# Patient Record
Sex: Male | Born: 1950 | Race: Black or African American | Hispanic: No | Marital: Married | Smoking: Former smoker
Health system: Southern US, Community
[De-identification: ages and names within clinical notes are randomized; demographics above are authoritative.]

## PROBLEM LIST (undated history)

## (undated) DIAGNOSIS — R972 Elevated prostate specific antigen [PSA]: Secondary | ICD-10-CM

## (undated) DIAGNOSIS — E785 Hyperlipidemia, unspecified: Secondary | ICD-10-CM

## (undated) DIAGNOSIS — Z8546 Personal history of malignant neoplasm of prostate: Secondary | ICD-10-CM

## (undated) DIAGNOSIS — I10 Essential (primary) hypertension: Secondary | ICD-10-CM

## (undated) DIAGNOSIS — M109 Gout, unspecified: Secondary | ICD-10-CM

## (undated) DIAGNOSIS — C801 Malignant (primary) neoplasm, unspecified: Secondary | ICD-10-CM

## (undated) DIAGNOSIS — N189 Chronic kidney disease, unspecified: Secondary | ICD-10-CM

## (undated) DIAGNOSIS — E739 Lactose intolerance, unspecified: Secondary | ICD-10-CM

## (undated) DIAGNOSIS — E119 Type 2 diabetes mellitus without complications: Secondary | ICD-10-CM

## (undated) DIAGNOSIS — C61 Malignant neoplasm of prostate: Secondary | ICD-10-CM

## (undated) DIAGNOSIS — D649 Anemia, unspecified: Secondary | ICD-10-CM

## (undated) HISTORY — DX: Malignant (primary) neoplasm, unspecified: C80.1

## (undated) HISTORY — PX: ESOPHAGOGASTRODUODENOSCOPY: SHX1529

## (undated) HISTORY — DX: Type 2 diabetes mellitus without complications: E11.9

## (undated) HISTORY — DX: Personal history of malignant neoplasm of prostate: Z85.46

## (undated) HISTORY — DX: Gout, unspecified: M10.9

## (undated) HISTORY — PX: COLONOSCOPY: SHX174

## (undated) HISTORY — DX: Essential (primary) hypertension: I10

## (undated) HISTORY — DX: Lactose intolerance, unspecified: E73.9

## (undated) HISTORY — DX: Anemia, unspecified: D64.9

## (undated) HISTORY — DX: Elevated prostate specific antigen (PSA): R97.20

## (undated) HISTORY — DX: Chronic kidney disease, unspecified: N18.9

## (undated) HISTORY — DX: Hyperlipidemia, unspecified: E78.5

---

## 2000-06-14 ENCOUNTER — Emergency Department (HOSPITAL_COMMUNITY): Admission: EM | Admit: 2000-06-14 | Discharge: 2000-06-14 | Payer: Self-pay | Admitting: Emergency Medicine

## 2002-08-11 ENCOUNTER — Encounter: Payer: Self-pay | Admitting: Emergency Medicine

## 2002-08-11 ENCOUNTER — Emergency Department (HOSPITAL_COMMUNITY): Admission: EM | Admit: 2002-08-11 | Discharge: 2002-08-11 | Payer: Self-pay | Admitting: Emergency Medicine

## 2007-12-11 HISTORY — PX: PROSTATE SURGERY: SHX751

## 2008-03-12 ENCOUNTER — Ambulatory Visit: Payer: Self-pay | Admitting: Internal Medicine

## 2008-03-12 DIAGNOSIS — E739 Lactose intolerance, unspecified: Secondary | ICD-10-CM

## 2008-03-12 DIAGNOSIS — I10 Essential (primary) hypertension: Secondary | ICD-10-CM | POA: Insufficient documentation

## 2008-03-12 HISTORY — DX: Lactose intolerance, unspecified: E73.9

## 2008-03-12 HISTORY — DX: Essential (primary) hypertension: I10

## 2008-03-18 ENCOUNTER — Encounter: Payer: Self-pay | Admitting: Internal Medicine

## 2008-03-18 DIAGNOSIS — R972 Elevated prostate specific antigen [PSA]: Secondary | ICD-10-CM | POA: Insufficient documentation

## 2008-03-18 HISTORY — DX: Elevated prostate specific antigen (PSA): R97.20

## 2008-03-22 ENCOUNTER — Encounter (INDEPENDENT_AMBULATORY_CARE_PROVIDER_SITE_OTHER): Payer: Self-pay | Admitting: *Deleted

## 2008-04-12 ENCOUNTER — Ambulatory Visit: Payer: Self-pay | Admitting: Internal Medicine

## 2008-04-12 DIAGNOSIS — E785 Hyperlipidemia, unspecified: Secondary | ICD-10-CM

## 2008-04-12 DIAGNOSIS — E119 Type 2 diabetes mellitus without complications: Secondary | ICD-10-CM | POA: Insufficient documentation

## 2008-04-12 DIAGNOSIS — E1165 Type 2 diabetes mellitus with hyperglycemia: Secondary | ICD-10-CM | POA: Insufficient documentation

## 2008-04-12 HISTORY — DX: Type 2 diabetes mellitus without complications: E11.9

## 2008-04-12 HISTORY — DX: Hyperlipidemia, unspecified: E78.5

## 2008-04-23 ENCOUNTER — Encounter: Payer: Self-pay | Admitting: Internal Medicine

## 2008-04-30 ENCOUNTER — Encounter (HOSPITAL_COMMUNITY): Admission: RE | Admit: 2008-04-30 | Discharge: 2008-07-23 | Payer: Self-pay | Admitting: Urology

## 2008-05-11 ENCOUNTER — Encounter: Payer: Self-pay | Admitting: Internal Medicine

## 2008-05-18 ENCOUNTER — Ambulatory Visit: Admission: RE | Admit: 2008-05-18 | Discharge: 2008-06-24 | Payer: Self-pay | Admitting: Radiation Oncology

## 2008-05-26 ENCOUNTER — Encounter: Payer: Self-pay | Admitting: Internal Medicine

## 2008-08-09 ENCOUNTER — Inpatient Hospital Stay (HOSPITAL_COMMUNITY): Admission: RE | Admit: 2008-08-09 | Discharge: 2008-08-10 | Payer: Self-pay | Admitting: Urology

## 2008-08-09 ENCOUNTER — Encounter (INDEPENDENT_AMBULATORY_CARE_PROVIDER_SITE_OTHER): Payer: Self-pay | Admitting: Urology

## 2008-08-19 ENCOUNTER — Encounter: Payer: Self-pay | Admitting: Internal Medicine

## 2008-11-25 ENCOUNTER — Encounter: Payer: Self-pay | Admitting: Internal Medicine

## 2009-01-20 ENCOUNTER — Telehealth (INDEPENDENT_AMBULATORY_CARE_PROVIDER_SITE_OTHER): Payer: Self-pay | Admitting: *Deleted

## 2009-01-28 ENCOUNTER — Encounter: Payer: Self-pay | Admitting: Internal Medicine

## 2009-06-03 ENCOUNTER — Encounter: Payer: Self-pay | Admitting: Internal Medicine

## 2009-09-02 ENCOUNTER — Encounter: Payer: Self-pay | Admitting: Internal Medicine

## 2009-09-05 ENCOUNTER — Ambulatory Visit: Payer: Self-pay | Admitting: Internal Medicine

## 2009-09-05 DIAGNOSIS — Z8546 Personal history of malignant neoplasm of prostate: Secondary | ICD-10-CM

## 2009-09-05 HISTORY — DX: Personal history of malignant neoplasm of prostate: Z85.46

## 2009-09-07 LAB — CONVERTED CEMR LAB
Saturation Ratios: 12.9 % — ABNORMAL LOW (ref 20.0–50.0)
Transferrin: 271.9 mg/dL (ref 212.0–360.0)
Vitamin B-12: 428 pg/mL (ref 211–911)

## 2009-09-08 ENCOUNTER — Encounter (INDEPENDENT_AMBULATORY_CARE_PROVIDER_SITE_OTHER): Payer: Self-pay | Admitting: *Deleted

## 2009-09-08 LAB — CONVERTED CEMR LAB
ALT: 22 units/L (ref 0–53)
AST: 18 units/L (ref 0–37)
Alkaline Phosphatase: 57 units/L (ref 39–117)
BUN: 12 mg/dL (ref 6–23)
Basophils Relative: 0.7 % (ref 0.0–3.0)
Bilirubin Urine: NEGATIVE
Bilirubin, Direct: 0.1 mg/dL (ref 0.0–0.3)
Creatinine, Ser: 1 mg/dL (ref 0.4–1.5)
Creatinine,U: 106.6 mg/dL
Eosinophils Relative: 1.7 % (ref 0.0–5.0)
GFR calc non Af Amer: 98.83 mL/min (ref >60)
HCT: 33.5 % — ABNORMAL LOW (ref 39.0–52.0)
Leukocytes, UA: NEGATIVE
Microalb Creat Ratio: 3.8 mg/g (ref 0.0–30.0)
Microalb, Ur: 0.4 mg/dL (ref 0.0–1.9)
Monocytes Relative: 10.5 % (ref 3.0–12.0)
Neutrophils Relative %: 57.7 % (ref 43.0–77.0)
Nitrite: NEGATIVE
Platelets: 252 10*3/uL (ref 150.0–400.0)
Potassium: 4.6 meq/L (ref 3.5–5.1)
RBC: 4.24 M/uL (ref 4.22–5.81)
Specific Gravity, Urine: 1.02 (ref 1.000–1.030)
Total Bilirubin: 0.7 mg/dL (ref 0.3–1.2)
Total CHOL/HDL Ratio: 3
WBC: 3.7 10*3/uL — ABNORMAL LOW (ref 4.5–10.5)
pH: 5.5 (ref 5.0–8.0)

## 2009-09-12 ENCOUNTER — Telehealth: Payer: Self-pay | Admitting: Internal Medicine

## 2010-02-09 ENCOUNTER — Encounter: Payer: Self-pay | Admitting: Internal Medicine

## 2010-03-03 ENCOUNTER — Ambulatory Visit: Payer: Self-pay | Admitting: Internal Medicine

## 2010-07-07 ENCOUNTER — Encounter: Payer: Self-pay | Admitting: Internal Medicine

## 2010-07-12 ENCOUNTER — Telehealth: Payer: Self-pay | Admitting: Internal Medicine

## 2010-09-25 HISTORY — PX: OTHER SURGICAL HISTORY: SHX169

## 2010-10-03 ENCOUNTER — Ambulatory Visit: Payer: Self-pay | Admitting: Internal Medicine

## 2010-10-03 DIAGNOSIS — D649 Anemia, unspecified: Secondary | ICD-10-CM

## 2010-10-03 DIAGNOSIS — D509 Iron deficiency anemia, unspecified: Secondary | ICD-10-CM | POA: Insufficient documentation

## 2010-10-03 HISTORY — DX: Anemia, unspecified: D64.9

## 2010-10-04 LAB — CONVERTED CEMR LAB
BUN: 16 mg/dL (ref 6–23)
Cholesterol: 171 mg/dL (ref 0–200)
Creatinine, Ser: 1 mg/dL (ref 0.4–1.5)
GFR calc non Af Amer: 104.46 mL/min (ref >60)
Iron: 43 ug/dL (ref 42–165)
LDL Cholesterol: 81 mg/dL (ref 0–99)
Potassium: 4.6 meq/L (ref 3.5–5.1)
TSH: 0.71 microintl units/mL (ref 0.35–5.50)
VLDL: 13.6 mg/dL (ref 0.0–40.0)
Vitamin B-12: 629 pg/mL (ref 211–911)

## 2010-10-17 ENCOUNTER — Telehealth: Payer: Self-pay | Admitting: Internal Medicine

## 2010-11-07 ENCOUNTER — Encounter: Payer: Self-pay | Admitting: Internal Medicine

## 2010-12-31 ENCOUNTER — Encounter: Payer: Self-pay | Admitting: Urology

## 2011-01-07 LAB — CONVERTED CEMR LAB
Alkaline Phosphatase: 57 units/L (ref 39–117)
Basophils Absolute: 0 10*3/uL (ref 0.0–0.1)
Bilirubin, Direct: 0.1 mg/dL (ref 0.0–0.3)
Calcium: 9.4 mg/dL (ref 8.4–10.5)
Cholesterol: 221 mg/dL (ref 0–200)
Direct LDL: 160.5 mg/dL
Eosinophils Absolute: 0 10*3/uL (ref 0.0–0.7)
GFR calc Af Amer: 99 mL/min
GFR calc non Af Amer: 82 mL/min
Glucose, Bld: 221 mg/dL — ABNORMAL HIGH (ref 70–99)
HCT: 41.1 % (ref 39.0–52.0)
Hemoglobin: 13.2 g/dL (ref 13.0–17.0)
MCHC: 32 g/dL (ref 30.0–36.0)
MCV: 76.1 fL — ABNORMAL LOW (ref 78.0–100.0)
Monocytes Absolute: 0.4 10*3/uL (ref 0.1–1.0)
Monocytes Relative: 9.6 % (ref 3.0–12.0)
Neutro Abs: 2.4 10*3/uL (ref 1.4–7.7)
PSA: 31.85 ng/mL — ABNORMAL HIGH (ref 0.10–4.00)
Platelets: 267 10*3/uL (ref 150–400)
Potassium: 4.3 meq/L (ref 3.5–5.1)
RDW: 13.7 % (ref 11.5–14.6)
Sodium: 144 meq/L (ref 135–145)
TSH: 0.66 microintl units/mL (ref 0.35–5.50)
Total CHOL/HDL Ratio: 3.9
Total Protein: 7.3 g/dL (ref 6.0–8.3)
Triglycerides: 80 mg/dL (ref 0–149)

## 2011-01-09 NOTE — Letter (Signed)
Summary: [facility]   Imported By: Lennie Odor 02/16/2010 14:08:40  _____________________________________________________________________  External Attachment:    Type:   Image     Comment:   External Document

## 2011-01-09 NOTE — Letter (Signed)
Summary: [facility]  [facility]   Imported By: Lester [place] 07/14/2010 12:28:10  _____________________________________________________________________  External Attachment:    Type:   Image     Comment:   External Document

## 2011-01-09 NOTE — Progress Notes (Signed)
Summary: Rx to [facility]  Phone Note Refill Request Message from:  Patient on October 17, 2010 8:49 AM  Refills Requested: Medication #1:  TRIBENZOR 20-5-12.5 MG TABS 2 by mouth once daily   Dosage confirmed as above?Dosage Confirmed   Supply Requested: 1 year  Medication #2:  ACTOS 45 MG  TABS 1 by mouth once daily   Dosage confirmed as above?Dosage Confirmed   Supply Requested: 1 year  Medication #3:  SIMVASTATIN 20 MG TABS 1 by mouth once daily.   Dosage confirmed as above?Dosage Confirmed   Supply Requested: 1 year Pt called requesting Rx to [facility] pharmacy because he is completely out and it would take weeks before he got his meds if he had to wait for them to recieve it by mail then process.   Method Requested: Electronic Initial call taken by: Margaret Pyle, CMA,  October 17, 2010 8:48 AM    Prescriptions: [name] 20-5-12.5 MG TABS [facility]) 2 by mouth once daily  #180 x 3   Entered by:   Margaret Pyle, CMA   Authorized by:   Corwin Levins MD   Signed by:   Margaret Pyle, CMA on 10/17/2010   Method used:   Electronically to        [facility]* (retail)             ,          Ph: [phone]       Fax: [phone]   RxID:   [id number] SIMVASTATIN 20 MG TABS (SIMVASTATIN) 1 by mouth once daily  #90 x 3   Entered by:   Margaret Pyle, CMA   Authorized by:   Corwin Levins MD   Signed by:   Margaret Pyle, CMA on 10/17/2010   Method used:   Electronically to        [facility]* (retail)             ,          Ph: [phone]       Fax: [phone]   RxID:   [id number] ACTOS 45 MG  TABS (PIOGLITAZONE HCL) 1 by mouth once daily  #90 x 3   Entered by:   Margaret Pyle, CMA   Authorized by:   Corwin Levins MD   Signed by:   Margaret Pyle, CMA on 10/17/2010   Method used:   Electronically to        [facility]* (retail)             ,          Ph: [phone]       Fax:  [phone]   RxID:   [id number]

## 2011-01-09 NOTE — Progress Notes (Signed)
Summary: Medication change  Phone Note From Pharmacy   Caller: [facility]* Summary of Call: Pharmacy stating patient is taking Simvastatin and amlodipine, advise if need to reduce the Simvastatin due to FDA warnings to not exceed Simvastatin 20mg  per day when taking amlodipine. Initial call taken by: Robin Ewing CMA Duncan Dull),  July 12, 2010 2:53 PM  Follow-up for Phone Call        ok   for simvastatin 20 mg  - to robin to handle per routine refill protocol Follow-up by: Corwin Levins MD,  July 12, 2010 3:31 PM    New/Updated Medications: SIMVASTATIN 20 MG TABS (SIMVASTATIN) 1 by mouth once daily Prescriptions: SIMVASTATIN 20 MG TABS (SIMVASTATIN) 1 by mouth once daily  #90 x 0   Entered by:   Scharlene Gloss CMA (AAMA)   Authorized by:   Corwin Levins MD   Signed by:   Scharlene Gloss CMA (AAMA) on 07/12/2010   Method used:   Faxed to ...       [facility]* (retail)             ,          Ph: [phone]       Fax: [phone]   RxID:   [id number]

## 2011-01-09 NOTE — Letter (Signed)
[facility]   Imported By: Sherian Rein 11/13/2010 10:58:26  _____________________________________________________________________  External Attachment:    Type:   Image     Comment:   External Document

## 2011-01-09 NOTE — Assessment & Plan Note (Signed)
Summary: POST HOSP PER [facility]--DISCH'D 10/18-CHES...   Vital Signs:  Patient profile:   60 year old male Height:      67 inches Weight:      267.50 pounds BMI:     42.05 O2 Sat:      97 % on Room air Temp:     98.5 degrees F oral Pulse rate:   71 / minute BP sitting:   126 / 82  (left arm) Cuff size:   large  Vitals Entered By: Zella Ball Ewing CMA Duncan Dull) (October 03, 2010 11:11 AM)  O2 Flow:  Room air  Preventive Care Screening  Last Flu Shot:    Date:  09/26/2010    Results:  given      declines tetanu and pneumonvax  CC: post Hospital/RE   CC:  post Hospital/RE.  History of Present Illness: here for wellness and f/.u; was hospd recently with CP and back pain, and slight anemia - cxr, ecg done and MI r/o'd,  had neg stress echo one wk ago; Pt denies further CP, worsening sob, doe, wheezing, orthopnea, pnd, worsening LE edema, palps, dizziness or syncope  Pt denies new neuro symptoms such as headache, facial or extremity weakness  Pt denies polydipsia, polyuria, or low sugar symptoms such as shakiness improved with eating.  Overall good compliance with meds, trying to follow low chol, DM diet, wt stable, little excercise however  Denies worsening depressive symptoms, suicidal ideation, or panic.  No fever, wt loss, night sweats, loss of appetite or other constitutional symptoms Pt states good ability with ADL's, low fall risk, home safety reviewed and adequate, no significant change in hearing or vision, trying to follow lower chol diet, and occasionally active only with regular excercise.   Preventive Screening-Counseling & Management      Drug Use:  no.    Problems Prior to Update: 1)  Anemia-nos  (ICD-285.9) 2)  Preventive Health Care  (ICD-V70.0) 3)  Prostate Cancer, Hx of  (ICD-V10.46) 4)  Hyperlipidemia  (ICD-272.4) 5)  Diabetes Mellitus, Type II  (ICD-250.00) 6)  Psa, Increased  (ICD-790.93) 7)  Glucose Intolerance  (ICD-271.3) 8)  Preventive  Health Care  (ICD-V70.0) 9)  Hypertension  (ICD-401.9)  Medications Prior to Update: 1)  Tribenzor 20-5-12.5 Mg Tabs (Olmesartan-Amlodipine-Hctz) .... 2 By Mouth Once Daily 2)  Actos 45 Mg  Tabs (Pioglitazone Hcl) .[name].. 1 By Mouth Once Daily 3)  Freestyle Lite Test   Strp (Glucose Blood) .... Use Asd 1 Bid 4)  Freestyle Lancets   Misc (Lancets) .... Use Asd Bid 5)  Adult Aspirin Ec Low Strength 81 Mg  Tbec (Aspirin) .[name].. 1po Qd 6)  Metformin Hcl 500 Mg  Tabs (Metformin Hcl) .... 2 By Mouth Qam 7)  Simvastatin 20 Mg Tabs (Simvastatin) .[name].. 1 By Mouth Once Daily  Current Medications (verified): 1)  Tribenzor 20-5-12.5 Mg Tabs (Olmesartan-Amlodipine-Hctz) .... 2 By Mouth Once Daily 2)  Actos 45 Mg  Tabs (Pioglitazone Hcl) .[name].. 1 By Mouth Once Daily 3)  Freestyle Lite Test   Strp (Glucose Blood) .... Use Asd 1 Bid 4)  Freestyle Lancets   Misc (Lancets) .... Use Asd Two Times A Day 5)  Adult Aspirin Ec Low Strength 81 Mg  Tbec (Aspirin) .[name].. 1po Qd 6)  Metformin Hcl 1000 Mg Tabs (Metformin Hcl) .[name].. 1po Two Times A Day 7)  Simvastatin 20 Mg Tabs (Simvastatin) .[name].. 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 Married 3 children Former Smoker Alcohol use-no Drug use-no  Risk Factors: Smoking Status: quit (03/12/2008)  Past Medical History: Hypertension Diabetes mellitus, type II Hyperlipidemia increased PSA Prostate cancer, hx of Anemia-NOS  Past Surgical History: Denies surgical history neg stress echo Sep 25, 2010  - [place]  Social History: 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         all otherwise negative per pt -    Physical Exam  General:  alert and overweight-appearing.   Head:  normocephalic and atraumatic.   Eyes:  vision grossly intact, pupils equal, and pupils round.   Ears:  R ear normal and L ear normal.   Nose:  no external deformity and no nasal discharge.   Mouth:  good dentition and pharynx pink and moist.   Neck:  supple and no masses.   Lungs:  normal respiratory effort and normal breath sounds.   Heart:  normal rate, regular rhythm, and no murmur.   Abdomen:  soft, non-tender, and normal bowel sounds.   Msk:  no joint tenderness and no joint swelling.   Extremities:  no edema, no erythema  Neurologic:  cranial nerves II-XII intact and strength normal in all extremities.   Skin:  color normal and no rashes.   Psych:  not depressed appearing and slightly anxious.     Impression & Recommendations:  Problem # 1:  PREVENTIVE HEALTH CARE (ICD-V70.0) Overall doing well, age appropriate education and counseling updated, referral for preventive services and immunizations addressed, dietary counseling and smoking status adressed , most recent labs reviewed, ecg reviewed or declined I have personally reviewed and have noted 1.The patient's medical and social history 2.Their use of alcohol, tobacco or illicit drugs 3.Their current medications and supplements 4. Functional ability including ADL's, fall risk, home safety risk, hearing & visual impairment  5.Diet and  physical activities 6.Evidence for depression or mood disorders The patients weight, height, BMI  have been recorded in the chart I have made referrals, counseling and provided education to the patient based review of the above  Orders: Gastroenterology Referral (GI) TLB-TSH (Thyroid Stimulating Hormone) (84443-TSH)  Problem # 2:  DIABETES MELLITUS, TYPE II (ICD-250.00)  His updated medication list for this problem includes:    Tribenzor 20-5-12.5 Mg Tabs (Olmesartan-amlodipine-hctz) .[name]... 2 by mouth once daily    Actos 45 Mg Tabs (Pioglitazone hcl) .[name]... 1 by mouth once daily    Adult Aspirin Ec Low Strength 81 Mg Tbec (Aspirin) .[name]... 1po qd    Metformin Hcl 1000 Mg Tabs (Metformin hcl) .[name]... 1po two times a day for a1c check, stable overall by hx and exam, ok to continue meds/tx as is .Pt to cont DM diet, excercise, wt control efforts; to check labs today   Orders: TLB-A1C / Hgb A1C (Glycohemoglobin) (83036-A1C) TLB-BMP (Basic Metabolic Panel-BMET) (80048-METABOL)  Problem # 3:  HYPERLIPIDEMIA (ICD-272.4)  His updated medication list for this problem includes:    Simvastatin 20 Mg Tabs (Simvastatin) .[name]... 1 by mouth once daily  Orders: TLB-Lipid Panel (80061-LIPID)  Labs Reviewed: SGOT: 18 (09/05/2009)   SGPT: 22 (09/05/2009)   HDL:70.00 (09/05/2009), 56.7 (03/12/2008)  LDL:DEL (03/12/2008)  Chol:232 (09/05/2009), 221 (03/12/2008)  Trig:95.0 (09/05/2009), 80 (03/12/2008) stable overall by hx and exam, ok to continue meds/tx as is   Problem # 4:  PROSTATE CANCER, HX OF (ICD-V10.46) currently off hormonal tx per urology - for f/u in 2 mo per pt  Problem # 5:  HYPERTENSION (ICD-401.9)  His updated medication list for this problem includes:    Tribenzor 20-5-12.5 Mg Tabs (Olmesartan-amlodipine-hctz) .[name]... 2 by mouth once daily  BP today: 126/82 Prior BP: 160/94 (09/05/2009)  Labs Reviewed: K+: 4.6 (09/05/2009) Creat: : 1.0 (09/05/2009)   Chol: 232 (09/05/2009)   HDL:  70.00 (09/05/2009)   LDL: DEL (03/12/2008)   TG: 95.0 (09/05/2009) stable overall by hx and exam, ok to continue meds/tx as is   Problem # 6:  ANEMIA-NOS (ICD-285.9) for lab f/u today, o/w stable overall by hx and exam, ok to continue meds/tx as is  Orders: TLB-B12 + Folate Pnl (04540_98119-J47/WGN) TLB-IBC Pnl (Iron/FE;Transferrin) (83550-IBC)  Complete Medication List: 1)  Tribenzor 20-5-12.5 Mg Tabs (Olmesartan-amlodipine-hctz) .... 2 by mouth once daily 2)  Actos 45 Mg Tabs (Pioglitazone hcl) .[name].. 1 by mouth once daily 3)  Freestyle Lite Test Strp (Glucose blood) .... Use asd 1 bid 4)  Freestyle Lancets Misc (Lancets) .... Use asd two times a day 5)  Adult Aspirin Ec Low Strength 81 Mg Tbec (Aspirin) .[name].. 1po qd 6)  Metformin Hcl 1000 Mg Tabs (Metformin hcl) .[name].. 1po two times a day 7)  Simvastatin 20 Mg Tabs (Simvastatin) .[name].. 1 by mouth once daily  Patient Instructions: 1)  You will be contacted about the referral(s) to: colonoscopy 2)  Please go to the Lab in the basement for your blood and/or urine tests today 3)  Please call the number on the [facility] Card for results of your testing  4)  Continue all previous medications as before this visit /you are given the refills 5)  Please schedule a follow-up appointment in 6 months with: 6)  BMP prior to visit, ICD-9: 250.02 7)  Lipid Panel prior to visit, ICD-9: 8)  HbgA1C prior to visit, ICD-9: Prescriptions: FREESTYLE LANCETS   MISC (LANCETS) use asd two times a day  #200 x 11   Entered and Authorized by:   Corwin Levins MD   Signed by:   Corwin Levins MD on 10/03/2010   Method used:   Print then Give to Patient   RxID:   [id number] FREESTYLE LITE TEST   STRP (GLUCOSE BLOOD) use asd 1 bid  #200 x 11   Entered and Authorized by:   Corwin Levins MD   Signed by:   Corwin Levins MD on 10/03/2010   Method used:   Print then Give to Patient   RxID:   [id number] SIMVASTATIN 20 MG TABS (SIMVASTATIN) 1 by mouth once daily  #90 x  3   Entered and Authorized by:   Corwin Levins MD   Signed by:   Corwin Levins MD on 10/03/2010   Method used:   Print then Give to Patient   RxID:   [id number] METFORMIN HCL 500 MG  TABS (METFORMIN HCL) 2 by mouth qam  #180 x 3   Entered and Authorized by:  Corwin Levins MD   Signed by:   Corwin Levins MD on 10/03/2010   Method used:   Print then Give to Patient   RxID:   [id number] ACTOS 45 MG  TABS (PIOGLITAZONE HCL) 1 by mouth once daily  #90 x 3   Entered and Authorized by:   Corwin Levins MD   Signed by:   Corwin Levins MD on 10/03/2010   Method used:   Print then Give to Patient   RxID:   [id number] QIONGEXBM 20-5-12.5 MG TABS [facility]) 2 by mouth once daily  #180 x 3   Entered and Authorized by:   Corwin Levins MD   Signed by:   Corwin Levins MD on 10/03/2010   Method used:   Print then Give to Patient   RxID:   [id number]    Orders Added: 1)  Gastroenterology Referral [GI] 2)  TLB-B12 + Folate Pnl [82746_82607-B12/FOL] 3)  TLB-IBC Pnl (Iron/FE;Transferrin) [83550-IBC] 4)  TLB-A1C / Hgb A1C (Glycohemoglobin) [83036-A1C] 5)  TLB-Lipid Panel [80061-LIPID] 6)  TLB-BMP (Basic Metabolic Panel-BMET) [80048-METABOL] 7)  TLB-TSH (Thyroid Stimulating Hormone) [84443-TSH] 8)  Est. Patient 40-64 years [64403]

## 2011-03-27 ENCOUNTER — Other Ambulatory Visit: Payer: Self-pay | Admitting: Internal Medicine

## 2011-03-30 ENCOUNTER — Other Ambulatory Visit: Payer: Self-pay | Admitting: Internal Medicine

## 2011-03-30 ENCOUNTER — Other Ambulatory Visit (INDEPENDENT_AMBULATORY_CARE_PROVIDER_SITE_OTHER): Payer: Self-pay

## 2011-03-30 LAB — BASIC METABOLIC PANEL
Calcium: 9.6 mg/dL (ref 8.4–10.5)
Creatinine, Ser: 1.2 mg/dL (ref 0.4–1.5)
GFR: 81.2 mL/min (ref >60.00)
Sodium: 143 mEq/L (ref 135–145)

## 2011-03-30 LAB — LIPID PANEL
HDL: 77.8 mg/dL (ref >39.00)
Triglycerides: 85 mg/dL (ref 0.0–149.0)

## 2011-03-30 NOTE — Telephone Encounter (Signed)
Note opened in error.

## 2011-04-01 ENCOUNTER — Encounter: Payer: Self-pay | Admitting: Internal Medicine

## 2011-04-01 DIAGNOSIS — R7302 Impaired glucose tolerance (oral): Secondary | ICD-10-CM | POA: Insufficient documentation

## 2011-04-01 DIAGNOSIS — Z0000 Encounter for general adult medical examination without abnormal findings: Secondary | ICD-10-CM | POA: Insufficient documentation

## 2011-04-01 DIAGNOSIS — Z0001 Encounter for general adult medical examination with abnormal findings: Secondary | ICD-10-CM | POA: Insufficient documentation

## 2011-04-02 ENCOUNTER — Other Ambulatory Visit: Payer: Self-pay

## 2011-04-02 MED ORDER — GLIMEPIRIDE 1 MG PO TABS: ORAL_TABLET | ORAL | Status: DC

## 2011-04-03 ENCOUNTER — Encounter: Payer: Self-pay | Admitting: Internal Medicine

## 2011-04-03 ENCOUNTER — Ambulatory Visit (INDEPENDENT_AMBULATORY_CARE_PROVIDER_SITE_OTHER): Payer: BC Managed Care – PPO | Admitting: Internal Medicine

## 2011-04-03 VITALS — BP 118/82 | HR 64 | Temp 98.6°F | Ht 66.0 in | Wt 266.4 lb

## 2011-04-03 DIAGNOSIS — E785 Hyperlipidemia, unspecified: Secondary | ICD-10-CM

## 2011-04-03 DIAGNOSIS — Z0000 Encounter for general adult medical examination without abnormal findings: Secondary | ICD-10-CM

## 2011-04-03 DIAGNOSIS — E119 Type 2 diabetes mellitus without complications: Secondary | ICD-10-CM

## 2011-04-03 DIAGNOSIS — I10 Essential (primary) hypertension: Secondary | ICD-10-CM

## 2011-04-03 NOTE — Assessment & Plan Note (Signed)
stable overall by hx and exam, most recent lab reviewed with pt, and pt to continue medical treatment as before  Lab Results  Component Value Date   LDLCALC 87 03/30/2011

## 2011-04-03 NOTE — Patient Instructions (Signed)
Continue all other medications as before Please return in 6 mo with Lab testing done 3-5 days before  

## 2011-04-03 NOTE — Assessment & Plan Note (Signed)
stable overall by hx and exam, most recent lab reviewed with pt, and pt to continue medical treatment as before  BP Readings from Last 3 Encounters:  04/03/11 118/82  10/03/10 126/82  09/05/09 160/94

## 2011-04-03 NOTE — Progress Notes (Signed)
  Subjective:    Patient ID: Marcus Rivera, male    DOB: 06/08/1951, 60 y.o.   MRN: [id number]  HPI  Here to f/u; overall doing ok,  Pt denies chest pain, increased sob or doe, wheezing, orthopnea, PND, increased LE swelling, palpitations, dizziness or syncope.  Pt denies new neurological symptoms such as new headache, or facial or extremity weakness or numbness   Pt denies polydipsia, polyuria, or low sugar symptoms such as weakness or confusion improved with po intake.  Pt states overall good compliance with meds, trying to follow lower cholesterol, diabetic diet, wt overall stable but little exercise however.   Pt denies fever, wt loss, night sweats, loss of appetite, or other constitutional symptoms Overall good compliance with treatment, and good medicine tolerability.   Past Medical History  Diagnosis Date  . DIABETES MELLITUS, TYPE II 04/12/2008  . GLUCOSE INTOLERANCE 03/12/2008  . HYPERLIPIDEMIA 04/12/2008  . ANEMIA-NOS 10/03/2010  . HYPERTENSION 03/12/2008  . PSA, INCREASED 03/18/2008  . PROSTATE CANCER, HX OF 09/05/2009   Past Surgical History  Procedure Date  . Negative stress echo Oct. 17, 2011    [facility]    reports that he has quit smoking. He does not have any smokeless tobacco history on file. He reports that he does not drink alcohol or use illicit drugs. family history includes Cancer in his mother; Diabetes in his other; Hypertension in his other; and Liver disease in his mother. No Known Allergies Current Outpatient Prescriptions on File Prior to Visit  Medication Sig Dispense Refill  . aspirin 81 MG EC tablet Take 81 mg by mouth daily.        [name] glimepiride (AMARYL) 1 MG tablet Take 1/2 by mouth per day  90 tablet  3  . glucose blood (FREESTYLE LITE) test strip 1 each by Other route. Use as directed two times daily       . Lancets (FREESTYLE) lancets 1 each by Other route 2 (two) times daily. Use as instructed       . metFORMIN (GLUCOPHAGE) 1000 MG tablet Take  1,000 mg by mouth 2 (two) times daily.        . Olmesartan-Amlodipine-HCTZ (TRIBENZOR) 20-5-12.5 MG TABS Take by mouth 2 (two) times daily.        . pioglitazone (ACTOS) 45 MG tablet Take 45 mg by mouth daily.        . simvastatin (ZOCOR) 20 MG tablet Take 20 mg by mouth daily.         Review of Systems. All otherwise neg per pt     Objective:   Physical Exam BP 118/82  Pulse 64  Temp(Src) 98.6 F (37 C) (Oral)  Ht 5\' 6"  (1.676 m)  Wt 266 lb 6 oz (120.827 kg)  BMI 42.99 kg/m2  SpO2 94% Physical Exam  VS noted Constitutional: Pt appears well-developed and well-nourished.  HENT: Head: Normocephalic.  Right Ear: External ear normal.  Left Ear: External ear normal.  Eyes: Conjunctivae and EOM are normal. Pupils are equal, round, and reactive to light.  Neck: Normal range of motion. Neck supple.  Cardiovascular: Normal rate and regular rhythm.   Pulmonary/Chest: Effort normal and breath sounds normal.  Abd:  Soft, NT, non-distended, + BS Neurological: Pt is alert. No cranial nerve deficit.  Skin: Skin is warm. No erythema.  Psychiatric: Pt behavior is normal. Thought content normal.         Assessment & Plan:

## 2011-04-03 NOTE — Assessment & Plan Note (Signed)
Uncontrolled mild, to add the glimeparide 1 mg - half per day;  Cont diet, exercise , wt loss efforts - f/u lab next visit  Lab Results  Component Value Date   HGBA1C 7.7* 03/30/2011

## 2011-04-24 NOTE — Op Note (Signed)
Marcus Rivera, Marcus Rivera                ACCOUNT NO.:  [id number]   MEDICAL RECORD NO.:  [id number]          PATIENT TYPE:  INP   LOCATION:  1437                         FACILITY:  [facility]   PHYSICIAN:  Valetta Fuller, M.D.  DATE OF BIRTH:  [birth year]   DATE OF PROCEDURE:  08/09/2008  DATE OF DISCHARGE:  08/10/2008                               OPERATIVE REPORT   PREOPERATIVE DIAGNOSIS:  Adenocarcinoma of the prostate.   POSTOPERATIVE DIAGNOSIS:  Adenocarcinoma of the prostate.   PROCEDURE PERFORMED:  Robotic-assisted laparoscopic radical retropubic  prostatectomy with bilateral lymph node dissection.   SURGEON:  Valetta Fuller, MD.   ANESTHESIA:  General endotracheal.   ASSISTANT:  Heloise Purpura, MD.   INDICATIONS:  Marcus Rivera is a 60 year old male who was originally sent  to see me because of an elevated PSA of just over 30.  At that time, the  patient had a moderate AUA symptom score.  Digital rectal exam revealed  a relatively small prostate.  I did not palpate any obvious definitive  abnormalities.  The patient subsequently had an ultrasound which  revealed a 30 gram prostate.  Biopsies unfortunately were positive in 5  of the 6 quadrants.  The majority of the tumor was Gleason's 3+4=7 with  a smaller foci of Gleason 6 cancer.  Involvement ranged from  approximately 20-50% of the biopsy material.  The patient had a  metastatic workup which was negative.  He understands he is at very high  risk for microscopic metastatic disease and/or local extension.  Clinically, no evidence of obvious disease outside the prostate.  The  patient underwent extensive counseling about treatment options.  He  appeared to understand the advantages and disadvantages of a surgical  approach and elected to proceed with that.  He presents now for robotic  prostatectomy.  The patient had placement of compression boots and  perioperative antibiotics were given.   TECHNIQUE/FINDINGS:  The patient was  brought to the operating room where  he had successful induction of general endotracheal anesthesia.  He was  placed in the mid lithotomy position and prepped and draped in usual  manner.  The patient was carefully secured to the operative bed and all  extremities carefully padded.  He was then placed in the steep  Trendelenburg position and prepped and draped in the usual manner.  A  Foley catheter was placed sterilely on the field.   The camera port incision was chosen 18 cm above the pubic symphysis just  to the left of the umbilicus.  A standard open Hasson technique was  utilized to place a 12 mm trocar and no obvious complications or  problems occurred.  Abdominal insufflation occurred without incident.  All other trocars were placed with direct visual guidance.  This  included a 12-mm and 5-mm assist port and three 8-mm robotic ports.  Once all ports were positioned, the surgical cart was docked.  The  bladder was filled with fluid to aid in identification and the  retropubic space was then dissected utilizing hot electrocautery  scissors as well as some blunt dissection.  The prostate and bladder  neck area were defatted of superficial fat and the endopelvic fascia was  then incised from base to the apex of the prostate.  Levator musculature  was swept off the apex of the prostate and the dorsal venous complex was  isolated and then taken with an ETS stapling device.  Hemostasis  remained excellent.  With the aid of the Foley balloon, the anterior  bladder neck was identified and then transected with electrocautery  scissors.  The Foley catheter was found in the midline and retracted  anteriorly.  No middle lobe was identified.  Indigo carmine was given  and we were well away from the ureteral orifices.  Posterior bladder  neck was then transected and then the vas deferens and seminal vesicles  were identified posteriorly.  These were individually dissected free  without  incident.  Posterior plane between the rectum and prostate was  then established.   The patient was not felt to be a good candidate for nerve spare.  For  that reason, the pedicles were taken laterally and we came across the  neurovascular bundle, electing to leave as much tissue on the lateral  aspect of the prostate as possible.  The apex was then transected going  through the anterior urethra and then the posterior urethra.  Prostatic  specimen was then removed.   Attention was then turned towards bilateral pelvic lymph node  dissection.  Lymph node packets in the obturator fossa extending up to  the common iliac artery were taken bilaterally.  Small venous channels  and lymphatics were taken with clips.  Obturator nerves identified  bilaterally and preserved.   Attention was then turned towards reconstruction.  The bladder neck was  actually quite small and required no closure.  A #2-0 Vicryl suture was  used to incorporate some of Denonvillier's fascia with the posterior  bladder neck and 6 o'clock position of the urethra.  This was used then  to reapproximate the structures and take any tension off the  anastomosis.  The rest of the anastomosis was then done with a double-  armed running #3-0 Monocryl suture in a 360 degree manner.  A new Foley  catheter was placed without incident and the bladder irrigated without  evidence of leakage.  A pelvic drain was placed through one of the  robotic ports and secured to the skin.  The specimen was removed with  the Endopouch.  A 12 mm trocar site was closed with the aid of a suture  passer utilizing Vicryl suture.  All other trocars were taken out with  direct visual inspection.  The camera port incision was extended  slightly to allow for removal of specimen and then the fascia was closed  with a #1 Vicryl suture.  All incisions were infiltrated with Marcaine  and closed with surgical clips.  The patient appeared to tolerate the   procedure well.  Blood loss was 200 mL.  He was brought to recovery room  in stable condition.      Valetta Fuller, M.D.  Electronically Signed     DSG/MEDQ  D:  08/10/2008  T:  08/10/2008  Job:  [id number]

## 2011-05-22 ENCOUNTER — Other Ambulatory Visit: Payer: Self-pay

## 2011-05-22 MED ORDER — GLIMEPIRIDE 1 MG PO TABS: ORAL_TABLET | ORAL | Status: DC

## 2011-08-14 ENCOUNTER — Other Ambulatory Visit: Payer: Self-pay

## 2011-08-14 MED ORDER — METFORMIN HCL 1000 MG PO TABS: 1000.0000 mg | ORAL_TABLET | Freq: Two times a day (BID) | ORAL | Status: DC

## 2011-09-12 LAB — HEMOGLOBIN AND HEMATOCRIT, BLOOD
HCT: 31.4 — ABNORMAL LOW
Hemoglobin: 10.3 — ABNORMAL LOW

## 2011-09-12 LAB — GLUCOSE, CAPILLARY
Glucose-Capillary: 130 — ABNORMAL HIGH
Glucose-Capillary: 202 — ABNORMAL HIGH

## 2011-09-26 ENCOUNTER — Other Ambulatory Visit: Payer: Self-pay

## 2011-09-26 MED ORDER — OLMESARTAN-AMLODIPINE-HCTZ 20-5-12.5 MG PO TABS: 1.0000 | ORAL_TABLET | Freq: Two times a day (BID) | ORAL | Status: DC

## 2011-09-26 MED ORDER — PIOGLITAZONE HCL 45 MG PO TABS: 45.0000 mg | ORAL_TABLET | Freq: Every day | ORAL | Status: DC

## 2011-09-26 MED ORDER — SIMVASTATIN 20 MG PO TABS: 20.0000 mg | ORAL_TABLET | Freq: Every day | ORAL | Status: DC

## 2011-10-04 ENCOUNTER — Ambulatory Visit: Payer: BC Managed Care – PPO | Admitting: Internal Medicine

## 2011-10-04 DIAGNOSIS — Z0289 Encounter for other administrative examinations: Secondary | ICD-10-CM

## 2011-11-16 ENCOUNTER — Encounter: Payer: Self-pay | Admitting: Internal Medicine

## 2011-11-16 ENCOUNTER — Other Ambulatory Visit (INDEPENDENT_AMBULATORY_CARE_PROVIDER_SITE_OTHER): Payer: BC Managed Care – PPO

## 2011-11-16 ENCOUNTER — Ambulatory Visit (INDEPENDENT_AMBULATORY_CARE_PROVIDER_SITE_OTHER): Payer: BC Managed Care – PPO | Admitting: Internal Medicine

## 2011-11-16 VITALS — BP 110/72 | HR 84 | Temp 98.1°F | Ht 67.0 in | Wt 271.2 lb

## 2011-11-16 DIAGNOSIS — Z0000 Encounter for general adult medical examination without abnormal findings: Secondary | ICD-10-CM

## 2011-11-16 DIAGNOSIS — E119 Type 2 diabetes mellitus without complications: Secondary | ICD-10-CM

## 2011-11-16 LAB — LIPID PANEL
Cholesterol: 181 mg/dL (ref 0–200)
HDL: 71.4 mg/dL (ref >39.00)
VLDL: 15 mg/dL (ref 0.0–40.0)

## 2011-11-16 LAB — HEMOGLOBIN A1C: Hgb A1c MFr Bld: 7.3 % — ABNORMAL HIGH (ref 4.6–6.5)

## 2011-11-16 LAB — URINALYSIS, ROUTINE W REFLEX MICROSCOPIC
Bilirubin Urine: NEGATIVE
Hgb urine dipstick: NEGATIVE
Total Protein, Urine: NEGATIVE
Urine Glucose: NEGATIVE

## 2011-11-16 LAB — BASIC METABOLIC PANEL
BUN: 18 mg/dL (ref 6–23)
CO2: 32 mEq/L (ref 19–32)
Glucose, Bld: 110 mg/dL — ABNORMAL HIGH (ref 70–99)
Potassium: 4.8 mEq/L (ref 3.5–5.1)
Sodium: 141 mEq/L (ref 135–145)

## 2011-11-16 LAB — HEPATIC FUNCTION PANEL
ALT: 17 U/L (ref 0–53)
Bilirubin, Direct: 0.1 mg/dL (ref 0.0–0.3)
Total Bilirubin: 0.7 mg/dL (ref 0.3–1.2)

## 2011-11-16 LAB — CBC WITH DIFFERENTIAL/PLATELET
Basophils Relative: 0.3 % (ref 0.0–3.0)
Eosinophils Absolute: 0 10*3/uL (ref 0.0–0.7)
Hemoglobin: 11.8 g/dL — ABNORMAL LOW (ref 13.0–17.0)
Lymphs Abs: 1.1 10*3/uL (ref 0.7–4.0)
MCHC: 33 g/dL (ref 30.0–36.0)
MCV: 79.3 fl (ref 78.0–100.0)
Monocytes Absolute: 0.4 10*3/uL (ref 0.1–1.0)
Neutro Abs: 2.4 10*3/uL (ref 1.4–7.7)
RBC: 4.51 Mil/uL (ref 4.22–5.81)

## 2011-11-16 LAB — MICROALBUMIN / CREATININE URINE RATIO
Creatinine,U: 140.3 mg/dL
Microalb Creat Ratio: 0.8 mg/g (ref 0.0–30.0)

## 2011-11-16 NOTE — Assessment & Plan Note (Signed)

## 2011-11-16 NOTE — Assessment & Plan Note (Signed)
stable overall by hx and exam, most recent data reviewed with pt, and pt to continue medical treatment as before  BP Readings from Last 3 Encounters:  11/16/11 110/72  04/03/11 118/82  10/03/10 126/82

## 2011-11-16 NOTE — Patient Instructions (Signed)
Continue all other medications as before Please go to LAB in the Basement for the blood and/or urine tests to be done today Please call the phone number [phone] (the PhoneTree System) for results of testing in 2-3 days;  When calling, simply dial the number, and when prompted enter the MRN number above (the Medical Record Number) and the # key, then the message should start. Please return in 6 mo with Lab testing done 3-5 days before  

## 2011-11-16 NOTE — Progress Notes (Signed)
Subjective:    Patient ID: Marcus Rivera, male    DOB: 06/16/1951, 60 y.o.   MRN: [id number]  HPI  Here for wellness and f/u;  Overall doing ok;  Pt denies CP, worsening SOB, DOE, wheezing, orthopnea, PND, worsening LE edema, palpitations, dizziness or syncope.  Pt denies neurological change such as new Headache, facial or extremity weakness.  Pt denies polydipsia, polyuria, or low sugar symptoms. Pt states overall good compliance with treatment and medications, good tolerability, and trying to follow lower cholesterol diet.  Pt denies worsening depressive symptoms, suicidal ideation or panic. No fever, wt loss, night sweats, loss of appetite, or other constitutional symptoms.  Pt states good ability with ADL's, low fall risk, home safety reviewed and adequate, no significant changes in hearing or vision, and occasionally active with exercise.  CBG's low 100's. No acute complaints  PSA still zero, last saw urology nov 2012 Past Medical History  Diagnosis Date  . DIABETES MELLITUS, TYPE II 04/12/2008  . GLUCOSE INTOLERANCE 03/12/2008  . HYPERLIPIDEMIA 04/12/2008  . ANEMIA-NOS 10/03/2010  . HYPERTENSION 03/12/2008  . PSA, INCREASED 03/18/2008  . PROSTATE CANCER, HX OF 09/05/2009   Past Surgical History  Procedure Date  . Negative stress echo Oct. 17, 2011    [facility]    reports that he has quit smoking. He does not have any smokeless tobacco history on file. He reports that he does not drink alcohol or use illicit drugs. family history includes Cancer in his mother; Diabetes in his other; Hypertension in his other; and Liver disease in his mother. No Known Allergies Current Outpatient Prescriptions on File Prior to Visit  Medication Sig Dispense Refill  . aspirin 81 MG EC tablet Take 81 mg by mouth daily.        [name] glimepiride (AMARYL) 1 MG tablet Take 1/2 by mouth per day  90 tablet  3  . glucose blood (FREESTYLE LITE) test strip 1 each by Other route. Use as directed two times daily        . Lancets (FREESTYLE) lancets 1 each by Other route 2 (two) times daily. Use as instructed       . metFORMIN (GLUCOPHAGE) 1000 MG tablet Take 1 tablet (1,000 mg total) by mouth 2 (two) times daily.  180 tablet  2  . Olmesartan-Amlodipine-HCTZ (TRIBENZOR) 20-5-12.5 MG TABS Take 1 tablet by mouth 2 (two) times daily.  180 tablet  1  . pioglitazone (ACTOS) 45 MG tablet Take 1 tablet (45 mg total) by mouth daily.  90 tablet  1  . simvastatin (ZOCOR) 20 MG tablet Take 1 tablet (20 mg total) by mouth daily.  90 tablet  1   Review of Systems Review of Systems  Constitutional: Negative for diaphoresis, activity change, appetite change and unexpected weight change.  HENT: Negative for hearing loss, ear pain, facial swelling, mouth sores and neck stiffness.   Eyes: Negative for pain, redness and visual disturbance.  Respiratory: Negative for shortness of breath and wheezing.   Cardiovascular: Negative for chest pain and palpitations.  Gastrointestinal: Negative for diarrhea, blood in stool, abdominal distention and rectal pain.  Genitourinary: Negative for hematuria, flank pain and decreased urine volume.  Musculoskeletal: Negative for myalgias and joint swelling.  Skin: Negative for color change and wound.  Neurological: Negative for syncope and numbness.  Hematological: Negative for adenopathy.  Psychiatric/Behavioral: Negative for hallucinations, self-injury, decreased concentration and agitation.      Objective:   Physical Exam BP 110/72  Pulse 84  Temp(Src) 98.1 F (36.7 C) (Oral)  Ht 5\' 7"  (1.702 m)  Wt 271 lb 4 oz (123.038 kg)  BMI 42.48 kg/m2  SpO2 97% Physical Exam  VS noted Constitutional: Pt is oriented to person, place, and time. Appears well-developed and well-nourished.  HENT:  Head: Normocephalic and atraumatic.  Right Ear: External ear normal.  Left Ear: External ear normal.  Nose: Nose normal.  Mouth/Throat: Oropharynx is clear and moist.  Eyes: Conjunctivae and  EOM are normal. Pupils are equal, round, and reactive to light.  Neck: Normal range of motion. Neck supple. No JVD present. No tracheal deviation present.  Cardiovascular: Normal rate, regular rhythm, normal heart sounds and intact distal pulses.   Pulmonary/Chest: Effort normal and breath sounds normal.  Abdominal: Soft. Bowel sounds are normal. There is no tenderness.  Musculoskeletal: Normal range of motion. Exhibits no edema.  Lymphadenopathy:  Has no cervical adenopathy.  Neurological: Pt is alert and oriented to person, place, and time. Pt has normal reflexes. No cranial nerve deficit.  Skin: Skin is warm and dry. No rash noted.  Psychiatric:  Has  normal mood and affect. Behavior is normal.     Assessment & Plan:

## 2012-03-01 ENCOUNTER — Other Ambulatory Visit: Payer: Self-pay | Admitting: Internal Medicine

## 2012-04-17 ENCOUNTER — Other Ambulatory Visit: Payer: Self-pay | Admitting: Internal Medicine

## 2012-06-29 ENCOUNTER — Other Ambulatory Visit: Payer: Self-pay | Admitting: Internal Medicine

## 2012-09-20 ENCOUNTER — Ambulatory Visit (INDEPENDENT_AMBULATORY_CARE_PROVIDER_SITE_OTHER): Payer: BC Managed Care – PPO | Admitting: Family Medicine

## 2012-09-20 ENCOUNTER — Encounter: Payer: Self-pay | Admitting: Family Medicine

## 2012-09-20 VITALS — BP 120/80 | Temp 97.8°F | Wt 269.0 lb

## 2012-09-20 DIAGNOSIS — M109 Gout, unspecified: Secondary | ICD-10-CM | POA: Insufficient documentation

## 2012-09-20 MED ORDER — NAPROXEN 500 MG PO TABS: ORAL_TABLET | ORAL | Status: DC

## 2012-09-20 MED ORDER — COLCHICINE 0.6 MG PO TABS: ORAL_TABLET | ORAL | Status: DC

## 2012-09-20 NOTE — Patient Instructions (Addendum)
Hold simvastatin for 3 days while starting colchicine. I do think this is gout - take prescription strength naprosyn and colchicine as directed. Rest foot. Let us know if not improving as expected or if having more frequent flares See below for gout diet.  Gout Gout is an inflammatory condition (arthritis) caused by a buildup of uric acid crystals in the joints. Uric acid is a chemical that is normally present in the blood. Under some circumstances, uric acid can form into crystals in your joints. This causes joint redness, soreness, and swelling (inflammation). Repeat attacks are common. Over time, uric acid crystals can form into masses (tophi) near a joint, causing disfigurement. Gout is treatable and often preventable. CAUSES  The disease begins with elevated levels of uric acid in the blood. Uric acid is produced by your body when it breaks down a naturally found substance called purines. This also happens when you eat certain foods such as meats and fish. Causes of an elevated uric acid level include:  Being passed down from parent to child (heredity).  Diseases that cause increased uric acid production (obesity, psoriasis, some cancers).  Excessive alcohol use.  Diet, especially diets rich in meat and seafood.  Medicines, including certain cancer-fighting drugs (chemotherapy), diuretics, and aspirin.  Chronic kidney disease. The kidneys are no longer able to remove uric acid well.  Problems with metabolism. Conditions strongly associated with gout include:  Obesity.  High blood pressure.  High cholesterol.  Diabetes. Not everyone with elevated uric acid levels gets gout. It is not understood why some people get gout and others do not. Surgery, joint injury, and eating too much of certain foods are some of the factors that can lead to gout. SYMPTOMS   An attack of gout comes on quickly. It causes intense pain with redness, swelling, and warmth in a joint.  Fever can  occur.  Often, only one joint is involved. Certain joints are more commonly involved:  Base of the big toe.  Knee.  Ankle.  Wrist.  Finger. Without treatment, an attack usually goes away in a few days to weeks. Between attacks, you usually will not have symptoms, which is different from many other forms of arthritis. DIAGNOSIS  Your caregiver will suspect gout based on your symptoms and exam. Removal of fluid from the joint (arthrocentesis) is done to check for uric acid crystals. Your caregiver will give you a medicine that numbs the area (local anesthetic) and use a needle to remove joint fluid for exam. Gout is confirmed when uric acid crystals are seen in joint fluid, using a special microscope. Sometimes, blood, urine, and X-ray tests are also used. TREATMENT  There are 2 phases to gout treatment: treating the sudden onset (acute) attack and preventing attacks (prophylaxis). Treatment of an Acute Attack  Medicines are used. These include anti-inflammatory medicines or steroid medicines.  An injection of steroid medicine into the affected joint is sometimes necessary.  The painful joint is rested. Movement can worsen the arthritis.  You may use warm or cold treatments on painful joints, depending which works best for you.  Discuss the use of coffee, vitamin C, or cherries with your caregiver. These may be helpful treatment options. Treatment to Prevent Attacks After the acute attack subsides, your caregiver may advise prophylactic medicine. These medicines either help your kidneys eliminate uric acid from your body or decrease your uric acid production. You may need to stay on these medicines for a very long time. The early phase of treatment  with prophylactic medicine can be associated with an increase in acute gout attacks. For this reason, during the first few months of treatment, your caregiver may also advise you to take medicines usually used for acute gout treatment. Be sure  you understand your caregiver's directions. You should also discuss dietary treatment with your caregiver. Certain foods such as meats and fish can increase uric acid levels. Other foods such as dairy can decrease levels. Your caregiver can give you a list of foods to avoid. HOME CARE INSTRUCTIONS   Do not take aspirin to relieve pain. This raises uric acid levels.  Only take over-the-counter or prescription medicines for pain, discomfort, or fever as directed by your caregiver.  Rest the joint as much as possible. When in bed, keep sheets and blankets off painful areas.  Keep the affected joint raised (elevated).  Use crutches if the painful joint is in your leg.  Drink enough water and fluids to keep your urine clear or pale yellow. This helps your body get rid of uric acid. Do not drink alcoholic beverages. They slow the passage of uric acid.  Follow your caregiver's dietary instructions. Pay careful attention to the amount of protein you eat. Your daily diet should emphasize fruits, vegetables, whole grains, and fat-free or low-fat milk products.  Maintain a healthy body weight. SEEK MEDICAL CARE IF:   You have an oral temperature above 102 F (38.9 C).  You develop diarrhea, vomiting, or any side effects from medicines.  You do not feel better in 24 hours, or you are getting worse. SEEK IMMEDIATE MEDICAL CARE IF:   Your joint becomes suddenly more tender and you have:  Chills.  An oral temperature above 102 F (38.9 C), not controlled by medicine. MAKE SURE YOU:   Understand these instructions.  Will watch your condition.  Will get help right away if you are not doing well or get worse. Document Released: 11/23/2000 Document Revised: 02/18/2012 Document Reviewed: 03/06/2010 [facility] Patient Information 2013 [place], [place].

## 2012-09-20 NOTE — Progress Notes (Signed)
  Subjective:    Patient ID: Marcus Rivera, male    DOB: 1951-06-06, 61 y.o.   MRN: [id number]  HPI CC: ?gout  Treated for gout in past at [facility], told did have this dx but not in chart.  This was 5 yrs ago.  Has possibly had other attacks since then.  For last 5 days having L MCP pain.  + swelling, warm and red.  So far has tried aleve bid, but not resolving pain.  No fevers/chills, denies inciting trauma/injury.  Past Medical History  Diagnosis Date  . DIABETES MELLITUS, TYPE II 04/12/2008  . GLUCOSE INTOLERANCE 03/12/2008  . HYPERLIPIDEMIA 04/12/2008  . ANEMIA-NOS 10/03/2010  . HYPERTENSION 03/12/2008  . PSA, INCREASED 03/18/2008  . PROSTATE CANCER, HX OF 09/05/2009    Lab Results  Component Value Date   CREATININE 1.2 11/16/2011   Review of Systems Per HPI    Objective:   Physical Exam Obese AAM, NAD. R foot WNL L foot:  1st MTP joint swollen slightly erythematous and warm, tender to palpation mainly laterally.  No pain with axial loading of big toe.      Assessment & Plan:

## 2012-09-20 NOTE — Assessment & Plan Note (Addendum)
With acute flare of podagra. Treat with colchicine and naprosyn. See pt instructions

## 2012-09-22 ENCOUNTER — Telehealth: Payer: Self-pay

## 2012-09-22 NOTE — Telephone Encounter (Signed)
[facility] Triage Call Report Triage Record Num: [id number] Operator: Candida Peeling Patient Name: Marcus Rivera Call Date & Time: 09/20/2012 1:01:21PM Patient Phone: [phone] PCP: Marinell Blight Patient Gender: Male PCP Fax : Patient DOB: Apr 10, 1951 Practice Name: [facility] Reason for Call: Caller: Heron/Patient; PCP: Oliver Barre (Adults only); CB#: [phone]; Call regarding seen in office earlier, seen by Dr Sharen Hones, 2 Rx were to [facility] to [facility] on [address] [phone] and they are not there. Epic reviewed and Rx were sent to [facility]. Pt informed and advised to contact [facility] that Rx called to local pharmacy, also reviewed w/ pt to hold Simvastatin for 3 days while on Colchicine. S/w Olivia at [facility] [address] [phone] and per Dr Sharen Hones Naproxen 500 mgm one po BID for one week then prn, take w/ food #40 NR and Colchicine 0.6 mgm po take 2 pills on first day followed by one pill later in day, then 1 pill BID prn gout pain #60 NR. Rx will be ready in 30 mins and caller informed. Protocol(s) Used: Office Note Recommended Outcome per Protocol: Information Noted and Sent to Office Reason for Outcome: Caller information to office Care Advice: ~ 09/20/2012 1:28:01PM Page 1 of 1 [email or web address]

## 2012-10-14 ENCOUNTER — Other Ambulatory Visit: Payer: Self-pay | Admitting: Internal Medicine

## 2012-11-05 ENCOUNTER — Other Ambulatory Visit: Payer: Self-pay | Admitting: Internal Medicine

## 2013-06-01 ENCOUNTER — Other Ambulatory Visit: Payer: Self-pay | Admitting: Internal Medicine

## 2013-11-02 ENCOUNTER — Other Ambulatory Visit: Payer: Self-pay | Admitting: Internal Medicine

## 2014-05-01 ENCOUNTER — Other Ambulatory Visit: Payer: Self-pay | Admitting: Internal Medicine

## 2014-06-22 ENCOUNTER — Other Ambulatory Visit (INDEPENDENT_AMBULATORY_CARE_PROVIDER_SITE_OTHER): Payer: BC Managed Care – PPO

## 2014-06-22 ENCOUNTER — Ambulatory Visit (INDEPENDENT_AMBULATORY_CARE_PROVIDER_SITE_OTHER): Payer: BC Managed Care – PPO | Admitting: Internal Medicine

## 2014-06-22 ENCOUNTER — Encounter: Payer: Self-pay | Admitting: Internal Medicine

## 2014-06-22 VITALS — BP 106/70 | HR 65 | Temp 98.3°F | Ht 67.0 in | Wt 242.0 lb

## 2014-06-22 DIAGNOSIS — Z0000 Encounter for general adult medical examination without abnormal findings: Secondary | ICD-10-CM

## 2014-06-22 DIAGNOSIS — E119 Type 2 diabetes mellitus without complications: Secondary | ICD-10-CM

## 2014-06-22 DIAGNOSIS — IMO0001 Reserved for inherently not codable concepts without codable children: Secondary | ICD-10-CM

## 2014-06-22 DIAGNOSIS — E1165 Type 2 diabetes mellitus with hyperglycemia: Secondary | ICD-10-CM

## 2014-06-22 LAB — CBC WITH DIFFERENTIAL/PLATELET
BASOS PCT: 0.4 % (ref 0.0–3.0)
Basophils Absolute: 0 10*3/uL (ref 0.0–0.1)
Eosinophils Absolute: 0 10*3/uL (ref 0.0–0.7)
Eosinophils Relative: 0.9 % (ref 0.0–5.0)
HEMATOCRIT: 35.1 % — AB (ref 39.0–52.0)
Hemoglobin: 11.2 g/dL — ABNORMAL LOW (ref 13.0–17.0)
LYMPHS ABS: 0.8 10*3/uL (ref 0.7–4.0)
LYMPHS PCT: 22.4 % (ref 12.0–46.0)
MCHC: 31.9 g/dL (ref 30.0–36.0)
MCV: 80.4 fl (ref 78.0–100.0)
MONOS PCT: 9.9 % (ref 3.0–12.0)
Monocytes Absolute: 0.4 10*3/uL (ref 0.1–1.0)
Neutro Abs: 2.4 10*3/uL (ref 1.4–7.7)
Neutrophils Relative %: 66.4 % (ref 43.0–77.0)
Platelets: 255 10*3/uL (ref 150.0–400.0)
RBC: 4.36 Mil/uL (ref 4.22–5.81)
RDW: 16 % — ABNORMAL HIGH (ref 11.5–15.5)
WBC: 3.7 10*3/uL — ABNORMAL LOW (ref 4.0–10.5)

## 2014-06-22 LAB — URINALYSIS, ROUTINE W REFLEX MICROSCOPIC
Bilirubin Urine: NEGATIVE
Hgb urine dipstick: NEGATIVE
KETONES UR: NEGATIVE
Leukocytes, UA: NEGATIVE
Nitrite: NEGATIVE
SPECIFIC GRAVITY, URINE: 1.02 (ref 1.000–1.030)
TOTAL PROTEIN, URINE-UPE24: NEGATIVE
URINE GLUCOSE: NEGATIVE
Urobilinogen, UA: 0.2 (ref 0.0–1.0)
pH: 5.5 (ref 5.0–8.0)

## 2014-06-22 LAB — MICROALBUMIN / CREATININE URINE RATIO
CREATININE, U: 139 mg/dL
Microalb Creat Ratio: 1.2 mg/g (ref 0.0–30.0)
Microalb, Ur: 1.7 mg/dL (ref 0.0–1.9)

## 2014-06-22 LAB — HEMOGLOBIN A1C: HEMOGLOBIN A1C: 6.5 % (ref 4.6–6.5)

## 2014-06-22 MED ORDER — OLMESARTAN-AMLODIPINE-HCTZ 20-5-12.5 MG PO TABS: 1.0000 | ORAL_TABLET | Freq: Two times a day (BID) | ORAL | Status: DC

## 2014-06-22 MED ORDER — PIOGLITAZONE HCL 45 MG PO TABS: 45.0000 mg | ORAL_TABLET | Freq: Every day | ORAL | Status: DC

## 2014-06-22 MED ORDER — SIMVASTATIN 20 MG PO TABS: 20.0000 mg | ORAL_TABLET | Freq: Every day | ORAL | Status: DC

## 2014-06-22 MED ORDER — METFORMIN HCL 1000 MG PO TABS: 1000.0000 mg | ORAL_TABLET | Freq: Two times a day (BID) | ORAL | Status: DC

## 2014-06-22 MED ORDER — GLIMEPIRIDE 1 MG PO TABS: 1.0000 mg | ORAL_TABLET | Freq: Every day | ORAL | Status: DC

## 2014-06-22 NOTE — Progress Notes (Signed)
Subjective:    Patient ID: Marcus Rivera, male    DOB: [birth year], 63 y.o.   MRN: [id number]  HPI  Here for wellness and f/u;  Overall doing ok;  Pt denies CP, worsening SOB, DOE, wheezing, orthopnea, PND, worsening LE edema, palpitations, dizziness or syncope.  Pt denies neurological change such as new headache, facial or extremity weakness.  Pt denies polydipsia, polyuria, or low sugar symptoms. Pt states overall good compliance with treatment and medications, good tolerability, and has been trying to follow lower cholesterol diet.  Pt denies worsening depressive symptoms, suicidal ideation or panic. No fever, night sweats, wt loss, loss of appetite, or other constitutional symptoms.  Pt states good ability with ADL's, has low fall risk, home safety reviewed and adequate, no other significant changes in hearing or vision, and only occasionally active with exercise  Sees urology for f/u prostate ca s/p surgury.  Only taking half glimeparide, has been able to lose wt from 269 to 242 with better diet and activity Past Medical History  Diagnosis Date  . DIABETES MELLITUS, TYPE II 04/12/2008  . GLUCOSE INTOLERANCE 03/12/2008  . HYPERLIPIDEMIA 04/12/2008  . ANEMIA-NOS 10/03/2010  . HYPERTENSION 03/12/2008  . PSA, INCREASED 03/18/2008  . PROSTATE CANCER, HX OF 09/05/2009  . Gout    Past Surgical History  Procedure Laterality Date  . Negative stress echo  Oct. 17, 2011    [facility]    reports that he has quit smoking. He does not have any smokeless tobacco history on file. He reports that he does not drink alcohol or use illicit drugs. family history includes Cancer in his mother; Diabetes in his other; Hypertension in his other; Liver disease in his mother. No Known Allergies Current Outpatient Prescriptions on File Prior to Visit  Medication Sig Dispense Refill  . aspirin 81 MG EC tablet Take 81 mg by mouth daily.        [name] glucose blood (FREESTYLE LITE) test strip 1 each by Other  route. Use as directed two times daily       . Lancets (FREESTYLE) lancets 1 each by Other route 2 (two) times daily. Use as instructed        No current facility-administered medications on file prior to visit.    Review of Systems Constitutional: Negative for increased diaphoresis, other activity, appetite or other siginficant weight change  HENT: Negative for worsening hearing loss, ear pain, facial swelling, mouth sores and neck stiffness.   Eyes: Negative for other worsening pain, redness or visual disturbance.  Respiratory: Negative for shortness of breath and wheezing.   Cardiovascular: Negative for chest pain and palpitations.  Gastrointestinal: Negative for diarrhea, blood in stool, abdominal distention or other pain Genitourinary: Negative for hematuria, flank pain or change in urine volume.  Musculoskeletal: Negative for myalgias or other joint complaints.  Skin: Negative for color change and wound.  Neurological: Negative for syncope and numbness. other than noted Hematological: Negative for adenopathy. or other swelling Psychiatric/Behavioral: Negative for hallucinations, self-injury, decreased concentration or other worsening agitation.      Objective:   Physical Exam BP 106/70  Pulse 65  Temp(Src) 98.3 F (36.8 C) (Oral)  Ht 5\' 7"  (1.702 m)  Wt 242 lb (109.77 kg)  BMI 37.89 kg/m2  SpO2 94% VS noted,  Constitutional: Pt is oriented to person, place, and time. Appears well-developed and well-nourished.  Head: Normocephalic and atraumatic.  Right Ear: External ear normal.  Left Ear: External ear normal.  Nose: Nose  normal.  Mouth/Throat: Oropharynx is clear and moist.  Eyes: Conjunctivae and EOM are normal. Pupils are equal, round, and reactive to light.  Neck: Normal range of motion. Neck supple. No JVD present. No tracheal deviation present.  Cardiovascular: Normal rate, regular rhythm, normal heart sounds and intact distal pulses.   Pulmonary/Chest: Effort  normal and breath sounds without rales or wheezing  Abdominal: Soft. Bowel sounds are normal. NT. No HSM  Musculoskeletal: Normal range of motion. Exhibits no edema.  Lymphadenopathy:  Has no cervical adenopathy.  Neurological: Pt is alert and oriented to person, place, and time. Pt has normal reflexes. No cranial nerve deficit. Motor grossly intact Skin: Skin is warm and dry. No rash noted.  Psychiatric:  Has normal mood and affect. Behavior is normal.      Assessment & Plan:

## 2014-06-22 NOTE — Progress Notes (Signed)
Pre visit review using our clinic review tool, if applicable. No additional management support is needed unless otherwise documented below in the visit note. 

## 2014-06-22 NOTE — Patient Instructions (Addendum)
Please continue all other medications as before, and refills have been done if requested.  Please have the pharmacy call with any other refills you may need.  Please continue your efforts at being more active, low cholesterol diet, and weight control.  You are otherwise up to date with prevention measures today.  Please keep your appointments with your specialists as you may have planned  You will be contacted regarding the referral for: colonoscopy  Please go to the LAB in the Basement (turn left off the elevator) for the tests to be done today  Letter sent, cont same tx  Please remember to sign up for MyChart if you have not done so, as this will be important to you in the future with finding out test results, communicating by private email, and scheduling acute appointments online when needed.  Please return in 6 months, or sooner if needed, with Lab testing done 3-5 days before

## 2014-06-23 LAB — BASIC METABOLIC PANEL
BUN: 20 mg/dL (ref 6–23)
CO2: 31 mEq/L (ref 19–32)
Calcium: 9.6 mg/dL (ref 8.4–10.5)
Chloride: 107 mEq/L (ref 96–112)
Creatinine, Ser: 1.2 mg/dL (ref 0.4–1.5)
GFR: 78.04 mL/min (ref >60.00)
Glucose, Bld: 95 mg/dL (ref 70–99)
POTASSIUM: 4.5 meq/L (ref 3.5–5.1)
SODIUM: 143 meq/L (ref 135–145)

## 2014-06-23 LAB — LIPID PANEL
Cholesterol: 167 mg/dL (ref 0–200)
HDL: 76.7 mg/dL (ref >39.00)
LDL Cholesterol: 78 mg/dL (ref 0–99)
NonHDL: 90.3
Total CHOL/HDL Ratio: 2
Triglycerides: 61 mg/dL (ref 0.0–149.0)
VLDL: 12.2 mg/dL (ref 0.0–40.0)

## 2014-06-23 LAB — HEPATIC FUNCTION PANEL
ALK PHOS: 36 U/L — AB (ref 39–117)
ALT: 20 U/L (ref 0–53)
AST: 19 U/L (ref 0–37)
Albumin: 4 g/dL (ref 3.5–5.2)
BILIRUBIN TOTAL: 0.6 mg/dL (ref 0.2–1.2)
Bilirubin, Direct: 0.1 mg/dL (ref 0.0–0.3)
Total Protein: 7 g/dL (ref 6.0–8.3)

## 2014-06-23 LAB — TSH: TSH: 0.71 u[IU]/mL (ref 0.35–4.50)

## 2014-06-25 NOTE — Assessment & Plan Note (Signed)
stable overall by history and exam, recent data reviewed with pt, and pt to continue medical treatment as before,  to f/u any worsening symptoms or concerns Lab Results  Component Value Date   HGBA1C 6.5 06/22/2014

## 2014-06-25 NOTE — Assessment & Plan Note (Signed)

## 2014-07-29 ENCOUNTER — Encounter: Payer: Self-pay | Admitting: Internal Medicine

## 2014-08-10 ENCOUNTER — Encounter: Payer: Self-pay | Admitting: Gastroenterology

## 2014-08-24 ENCOUNTER — Encounter: Payer: Self-pay | Admitting: Internal Medicine

## 2014-08-24 ENCOUNTER — Ambulatory Visit (INDEPENDENT_AMBULATORY_CARE_PROVIDER_SITE_OTHER): Payer: BC Managed Care – PPO | Admitting: Internal Medicine

## 2014-08-24 VITALS — BP 108/80 | HR 74 | Temp 98.8°F | Wt 233.0 lb

## 2014-08-24 DIAGNOSIS — M25569 Pain in unspecified knee: Secondary | ICD-10-CM

## 2014-08-24 DIAGNOSIS — M25561 Pain in right knee: Secondary | ICD-10-CM

## 2014-08-24 MED ORDER — HYDROCODONE-ACETAMINOPHEN 10-325 MG PO TABS: 1.0000 | ORAL_TABLET | Freq: Four times a day (QID) | ORAL | Status: DC | PRN

## 2014-08-24 NOTE — Progress Notes (Signed)
Pre visit review using our clinic review tool, if applicable. No additional management support is needed unless otherwise documented below in the visit note. 

## 2014-08-24 NOTE — Progress Notes (Signed)
   Subjective:    Patient ID: Marcus Rivera, male    DOB: 1951-08-06, 63 y.o.   MRN: [id number]  HPI  Here with acute onset sudden right knee pain with swelling, mod to severe since last Thursday, after walking for exercise the day prior, limps to walk, no fever, no trauma, walking makes worse, sitting makes better, no prior hx of same, or prior knee surgury. Does have hx of gout, colchicine tx has controlled. Past Medical History  Diagnosis Date  . DIABETES MELLITUS, TYPE II 04/12/2008  . GLUCOSE INTOLERANCE 03/12/2008  . HYPERLIPIDEMIA 04/12/2008  . ANEMIA-NOS 10/03/2010  . HYPERTENSION 03/12/2008  . PSA, INCREASED 03/18/2008  . PROSTATE CANCER, HX OF 09/05/2009  . Gout    Past Surgical History  Procedure Laterality Date  . Negative stress echo  Oct. 17, 2011    [facility]    reports that he has quit smoking. He does not have any smokeless tobacco history on file. He reports that he does not drink alcohol or use illicit drugs. family history includes Cancer in his mother; Diabetes in his other; Hypertension in his other; Liver disease in his mother. No Known Allergies Current Outpatient Prescriptions on File Prior to Visit  Medication Sig Dispense Refill  . aspirin 81 MG EC tablet Take 81 mg by mouth daily.        [name] glimepiride (AMARYL) 1 MG tablet Take 1 tablet (1 mg total) by mouth daily with breakfast.  90 tablet  3  . glucose blood (FREESTYLE LITE) test strip 1 each by Other route. Use as directed two times daily       . Lancets (FREESTYLE) lancets 1 each by Other route 2 (two) times daily. Use as instructed       . metFORMIN (GLUCOPHAGE) 1000 MG tablet Take 1 tablet (1,000 mg total) by mouth 2 (two) times daily with a meal.  180 tablet  3  . Olmesartan-Amlodipine-HCTZ (TRIBENZOR) 20-5-12.5 MG TABS Take 1 tablet by mouth 2 (two) times daily.  180 tablet  3  . pioglitazone (ACTOS) 45 MG tablet Take 1 tablet (45 mg total) by mouth daily.  90 tablet  3  . simvastatin (ZOCOR)  20 MG tablet Take 1 tablet (20 mg total) by mouth daily at 6 PM.  90 tablet  3   No current facility-administered medications on file prior to visit.   Review of Systems All otherwise neg per pt     Objective:   Physical Exam BP 108/80  Pulse 74  Temp(Src) 98.8 F (37.1 C) (Oral)  Wt 233 lb (105.688 kg)  SpO2 97% VS noted,  Constitutional: Pt appears well-developed, well-nourished.  HENT: Head: NCAT.  Right Ear: External ear normal.  Left Ear: External ear normal.  Eyes: . Pupils are equal, round, and reactive to light. Conjunctivae and EOM are normal Neck: Normal range of motion. Neck supple.  Cardiovascular: Normal rate and regular rhythm.   Pulmonary/Chest: Effort normal and breath sounds normal.  Neurological: Pt is alert. Not confused , motor grossly intact Skin: Skin is warm. No rash Psychiatric: Pt behavior is normal. No agitation.  Right knee with 2+ effusion, marked reduced ROM due to pain, limps to walk     Assessment & Plan:

## 2014-08-24 NOTE — Patient Instructions (Addendum)
Please take all new medication as prescribed  Please continue all other medications as before, and refills have been done if requested.  Please have the pharmacy call with any other refills you may need.  Please keep your appointments with your specialists as you may have planned  You will be contacted regarding the referral for: Dr Smith/sport med - urgent

## 2014-08-29 NOTE — Assessment & Plan Note (Signed)
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

## 2014-09-02 ENCOUNTER — Other Ambulatory Visit (INDEPENDENT_AMBULATORY_CARE_PROVIDER_SITE_OTHER): Payer: BC Managed Care – PPO

## 2014-09-02 ENCOUNTER — Encounter: Payer: Self-pay | Admitting: Family Medicine

## 2014-09-02 ENCOUNTER — Ambulatory Visit (INDEPENDENT_AMBULATORY_CARE_PROVIDER_SITE_OTHER): Payer: BC Managed Care – PPO | Admitting: Family Medicine

## 2014-09-02 ENCOUNTER — Ambulatory Visit (INDEPENDENT_AMBULATORY_CARE_PROVIDER_SITE_OTHER)
Admission: RE | Admit: 2014-09-02 | Discharge: 2014-09-02 | Disposition: A | Discharge status: Home / Self Care | Payer: BC Managed Care – PPO | Source: Ambulatory Visit | Attending: Family Medicine | Admitting: Family Medicine

## 2014-09-02 VITALS — BP 122/78 | HR 82 | Ht 66.0 in | Wt 237.0 lb

## 2014-09-02 DIAGNOSIS — S86119A Strain of other muscle(s) and tendon(s) of posterior muscle group at lower leg level, unspecified leg, initial encounter: Secondary | ICD-10-CM | POA: Insufficient documentation

## 2014-09-02 DIAGNOSIS — M25561 Pain in right knee: Secondary | ICD-10-CM

## 2014-09-02 DIAGNOSIS — S86819A Strain of other muscle(s) and tendon(s) at lower leg level, unspecified leg, initial encounter: Secondary | ICD-10-CM

## 2014-09-02 DIAGNOSIS — S838X9A Sprain of other specified parts of unspecified knee, initial encounter: Secondary | ICD-10-CM

## 2014-09-02 DIAGNOSIS — M25569 Pain in unspecified knee: Secondary | ICD-10-CM

## 2014-09-02 DIAGNOSIS — S86111A Strain of other muscle(s) and tendon(s) of posterior muscle group at lower leg level, right leg, initial encounter: Secondary | ICD-10-CM

## 2014-09-02 NOTE — Patient Instructions (Signed)
Very nice to meet you Ice 20 minutes 2 times daily. Usually after activity and before bed. Exercises 3 times a week.  Calf tear  Try a compression sleeve for the calf from [employer] or [employer].  Come back again in 3-4 weeks if not perfect  Start a walk-run progression: - I would like you to do line drills (try to keep foot on a line when jogging). - Initially start one minute walking than one minute running for 20 mins in the first week,   then 25 mins during the second week, then 30 mins afterwards.  Once you have reached 30 mins: - Run 2 mins, then walk 1 min. -Then run 3 mins, and walk 1 min. -Then run 4 mins, and walk 1 min. -Then run 5 mins, and walk 1 min. -Slowly build up weekly to running 30 mins nonstop.  If painful at any of the steps, back up one step.

## 2014-09-02 NOTE — Assessment & Plan Note (Signed)
Patient's right knee pain after physical exam and ultrasound shows the patient may have had actually a calf strain/tear. Patient has responded very well over the course of 3 weeks and decreasing his activity. Patient is given home exercises for this problem as well as an icing protocol. Patient's knee seems to be unrelated to we will get x-rays to rule out any underlying pathology especially with his history of cancer. If patient has any worsening swelling we will consider aspiration of the cyst but I'm guessing he will heal just fine. We discussed compression with activity and increasing his activity slowly over the course of the next several weeks. Patient will come back in 3-4 weeks for further evaluation and treatment.

## 2014-09-02 NOTE — Progress Notes (Signed)
  [facility] [address] [place], [place] [address] Phone: [phone] Subjective:    I'm seeing this patient by the request  of:  Cathlean Cower, MD   CC: Right knee pain OVZ:[id number] Marcus Rivera is a 63 y.o. male coming in with complaint of right knee pain. Patient states that he has had this pain now for going on 3 weeks. Patient states it seems to happen after walking for a long amount of time. Patient states now it is difficult to ambulate. Patient states any activity seems to make it worse in sitting seems to make it better. Patient has never injured this knee that he knows of. Patient did have swelling of the knee previously that has gotten better slowly. Denies any significant radiation down the leg or any numbness or tingling. Patient rates the severity is 7/10. Patient states that he can wake him up at night. Patient does have a past medical history significant for prostate cancer. Denies any fevers, chills, or any abnormal weight loss.     Past medical history, social, surgical and family history all reviewed in electronic medical record.   Review of Systems: No headache, visual changes, nausea, vomiting, diarrhea, constipation, dizziness, abdominal pain, skin rash, fevers, chills, night sweats, weight loss, swollen lymph nodes, body aches, joint swelling, muscle aches, chest pain, shortness of breath, mood changes.   Objective Blood pressure 122/78, pulse 82, height 5\' 6"  (1.676 m), weight 237 lb (107.502 kg), SpO2 95.00%.  General: No apparent distress alert and oriented x3 mood and affect normal, dressed appropriately.  HEENT: Pupils equal, extraocular movements intact  Respiratory: Patient's speak in full sentences and does not appear short of breath  Cardiovascular: No lower extremity edema, non tender, no erythema  Skin: Warm dry intact with no signs of infection or rash on extremities or on axial skeleton.  Abdomen: Soft nontender  Neuro:  Cranial nerves II through XII are intact, neurovascularly intact in all extremities with 2+ DTRs and 2+ pulses.  Lymph: No lymphadenopathy of posterior or anterior cervical chain or axillae bilaterally.  Gait normal with good balance and coordination.  MSK:  Non tender with full range of motion and good stability and symmetric strength and tone of shoulders, elbows, wrist, hip,  and ankles bilaterally.  Knee: Right Normal to inspection with no erythema or effusion or obvious bony abnormalities. Palpation normal with no warmth, joint line tenderness, patellar tenderness, or condyle tenderness. ROM full in flexion and extension and lower leg rotation. Ligaments with solid consistent endpoints including ACL, PCL, LCL, MCL. Negative Mcmurray's, Apley's, and Thessalonian tests. Non painful patellar compression. Patellar glide without crepitus. Patellar and quadriceps tendons unremarkable. Hamstring and quadriceps strength is normal.  Contralateral knee unremarkable  MSK US performed of: Right knee This study was ordered, performed, and interpreted by Charlann Boxer D.O.  Knee: All structures visualized. Anteromedial, anterolateral, posteromedial, and posterolateral menisci unremarkable without tearing, fraying, effusion, or displacement. Patellar Tendon unremarkable on long and transverse views without effusion. No abnormality of prepatellar bursa. LCL and MCL unremarkable on long and transverse views. Patient does have a cystic formation in the area of where the musculotendinous junction of the medial gastroc head is. I think this is likely will from a previous tear  IMPRESSION:  Medial gastrocnemius tear healing with cyst present.     Impression and Recommendations:     This case required medical decision making of moderate complexity.

## 2014-09-29 ENCOUNTER — Telehealth: Payer: Self-pay | Admitting: *Deleted

## 2014-09-29 NOTE — Telephone Encounter (Signed)
Pt was scheduled for pre-visit appointment on 09/29/14 for direct colonoscopy on 10/18/14 at [facility] with Dr Deatra Ina, pt did not show for pre-visit, home phone number is disconnected, tried mobile phone but unable to leave message-mailbox full

## 2014-09-30 ENCOUNTER — Ambulatory Visit (AMBULATORY_SURGERY_CENTER): Payer: Self-pay | Admitting: *Deleted

## 2014-09-30 VITALS — Ht 66.0 in | Wt 241.6 lb

## 2014-09-30 DIAGNOSIS — Z1211 Encounter for screening for malignant neoplasm of colon: Secondary | ICD-10-CM

## 2014-09-30 MED ORDER — NA SULFATE-K SULFATE-MG SULF 17.5-3.13-1.6 GM/177ML PO SOLN: 1.0000 | Freq: Once | ORAL | Status: DC

## 2014-09-30 NOTE — Progress Notes (Signed)
No egg or soy allergy. ewm No home 02 use. ewm No issues with past sedation. ewm No blood thinners. ewm No diet pills. ewm emmi video to pt's e mail. ewm

## 2014-10-05 ENCOUNTER — Encounter: Payer: Self-pay | Admitting: Gastroenterology

## 2014-10-18 ENCOUNTER — Encounter: Payer: BC Managed Care – PPO | Admitting: Gastroenterology

## 2014-10-28 ENCOUNTER — Other Ambulatory Visit: Payer: Self-pay

## 2014-10-28 DIAGNOSIS — Z1211 Encounter for screening for malignant neoplasm of colon: Secondary | ICD-10-CM

## 2014-11-02 ENCOUNTER — Emergency Department (HOSPITAL_COMMUNITY)
Admission: EM | Admit: 2014-11-02 | Discharge: 2014-11-02 | Disposition: A | Discharge status: Home / Self Care | Payer: BC Managed Care – PPO | Attending: Emergency Medicine | Admitting: Emergency Medicine

## 2014-11-02 ENCOUNTER — Encounter: Payer: Self-pay | Admitting: Internal Medicine

## 2014-11-02 ENCOUNTER — Encounter (HOSPITAL_COMMUNITY): Payer: Self-pay | Admitting: Emergency Medicine

## 2014-11-02 ENCOUNTER — Ambulatory Visit (INDEPENDENT_AMBULATORY_CARE_PROVIDER_SITE_OTHER): Payer: BC Managed Care – PPO | Admitting: Internal Medicine

## 2014-11-02 VITALS — BP 128/82 | HR 78 | Temp 98.2°F | Resp 13 | Wt 239.0 lb

## 2014-11-02 DIAGNOSIS — Z862 Personal history of diseases of the blood and blood-forming organs and certain disorders involving the immune mechanism: Secondary | ICD-10-CM | POA: Insufficient documentation

## 2014-11-02 DIAGNOSIS — N4882 Acquired torsion of penis: Secondary | ICD-10-CM

## 2014-11-02 DIAGNOSIS — E785 Hyperlipidemia, unspecified: Secondary | ICD-10-CM | POA: Insufficient documentation

## 2014-11-02 DIAGNOSIS — N4829 Other inflammatory disorders of penis: Secondary | ICD-10-CM | POA: Diagnosis not present

## 2014-11-02 DIAGNOSIS — Z8546 Personal history of malignant neoplasm of prostate: Secondary | ICD-10-CM | POA: Diagnosis not present

## 2014-11-02 DIAGNOSIS — N4889 Other specified disorders of penis: Secondary | ICD-10-CM

## 2014-11-02 DIAGNOSIS — R1909 Other intra-abdominal and pelvic swelling, mass and lump: Secondary | ICD-10-CM | POA: Diagnosis present

## 2014-11-02 DIAGNOSIS — Z87891 Personal history of nicotine dependence: Secondary | ICD-10-CM | POA: Diagnosis not present

## 2014-11-02 DIAGNOSIS — E119 Type 2 diabetes mellitus without complications: Secondary | ICD-10-CM | POA: Insufficient documentation

## 2014-11-02 DIAGNOSIS — I10 Essential (primary) hypertension: Secondary | ICD-10-CM | POA: Insufficient documentation

## 2014-11-02 DIAGNOSIS — Z79899 Other long term (current) drug therapy: Secondary | ICD-10-CM | POA: Diagnosis not present

## 2014-11-02 DIAGNOSIS — Z7982 Long term (current) use of aspirin: Secondary | ICD-10-CM | POA: Diagnosis not present

## 2014-11-02 MED ORDER — DEXAMETHASONE SODIUM PHOSPHATE 10 MG/ML IJ SOLN
10.0000 mg | Freq: Once | INTRAMUSCULAR | Status: AC
  Administered 2014-11-02: 10 mg via INTRAMUSCULAR
  Filled 2014-11-02: qty 1

## 2014-11-02 MED ORDER — CEPHALEXIN 500 MG PO CAPS: 1000.0000 mg | ORAL_CAPSULE | Freq: Two times a day (BID) | ORAL | Status: DC

## 2014-11-02 MED ORDER — PREDNISONE 10 MG PO TABS: 20.0000 mg | ORAL_TABLET | Freq: Every day | ORAL | Status: DC

## 2014-11-02 MED ORDER — FAMOTIDINE 20 MG PO TABS: 20.0000 mg | ORAL_TABLET | Freq: Two times a day (BID) | ORAL | Status: DC

## 2014-11-02 MED ORDER — HYDROCORTISONE 1 % EX CREA: TOPICAL_CREAM | CUTANEOUS | Status: DC

## 2014-11-02 NOTE — Discharge Instructions (Signed)
Return here as needed. Follow up with Dr. Risa Grill by call him for an appointment.

## 2014-11-02 NOTE — Progress Notes (Signed)
   Subjective:    Patient ID: Marcus Rivera, male    DOB: November 01, 1951, 63 y.o.   MRN: [id number]  HPI Yesterday he had mild swelling of the penis.   He awoke this morning with severe swelling & torsion of the penis   He questions whether this was related to working in the yard 10/30/14 when he sustained a rash over the right antecubital area. His had some itching of his genitalia but no definite rash  He has been able to urinate but has not been able to reduce the torsion and swelling of the penis.  Past history includes prostatectomy by Dr. Risa Grill for prostate cancer.He describes abnormal curvature to the penis (? Peyronies')   He is also diabetic.He states his fasting blood sugars run 90-low 100s. His last A1c was 6.5% in July of this year.   Review of Systems Dysuria, pyuria, hematuria, frequency, nocturia or polyuria are denied.  No associated itchy, watery eyes.  Swelling of the lips or tongue denied.  Shortness of breath, wheezing, or cough absent.  Fever ,chills , or sweats denied. Purulence absent.  Diarrhea not present.     Objective:   Physical Exam  Positive or pertinent findings: The penis is grossly swollen and twisted. The foreskin cannot be advanced. There is some urine dribbling from the urethra. Bland hyperpigmentation R antecubital area  General appearance :adequately nourished; in no distress. Eyes: No conjunctival inflammation or scleral icterus is present. Oral exam: Dental hygiene is good. Lips and gums are healthy appearing.There is no oropharyngeal erythema or exudate noted.  Heart:  Normal rate and regular rhythm. S1 and S2 normal without gallop, murmur, click, rub or other extra sounds Lungs:Chest clear to auscultation; no wheezes, rhonchi,rales ,or rubs present.No increased work of breathing.  Abdomen: bowel sounds normal, soft and non-tender without masses, organomegaly or hernias noted.  No guarding or rebound.  Vascular : all pulses equal ; no  bruits present. Skin:Warm & dry.   Lymphatic: No lymphadenopathy is noted about the head, neck, axilla, or inguinal areas.           Assessment & Plan:  #1 penile torsion ER as per Dr Cy Blamer office recommendations by phone

## 2014-11-02 NOTE — Patient Instructions (Signed)
Go to ER as per Dr Cy Blamer office recommendation

## 2014-11-02 NOTE — ED Notes (Signed)
Pt report penile painful swelling that started yesterday and got worse today. Pt denies penile discharge, fever or urinary symptoms. No scrotum swelling.

## 2014-11-02 NOTE — Progress Notes (Signed)
Pre visit review using our clinic review tool, if applicable. No additional management support is needed unless otherwise documented below in the visit note. 

## 2014-11-02 NOTE — ED Provider Notes (Signed)
63 year old male, history of hypertension and diabetes presents with a complaint of 2 days of swelling of the shaft of his penis, gradual in onset, minimally tender, no difficulty with passing urine. On exam the patient has what appears to be some angioedema of the distal shaft of his penis, the head of the penis looks normal, the urethra is open and there is no drainage or bleeding, the scrotum appears normal, there is no tenderness, no erythema or induration of the skin. The patient also has an associated rash to his right antecubital fossa consistent with what appears to be contact dermatitis after doing some yard work. The patient will receive medications as antiallergy medications, will discuss with urology to an arrange follow-up, appears stable for discharge, aware that he needs to return if he is unable to urinate.  Medical screening examination/treatment/procedure(s) were conducted as a shared visit with non-physician practitioner(s) and myself.  I personally evaluated the patient during the encounter.  Clinical Impression:   Final diagnoses:  Penile swelling         Johnna Acosta, MD 11/03/14 2354

## 2014-11-02 NOTE — ED Provider Notes (Signed)
CSN: [id number]     Arrival date & time 11/02/14  1632 History   First MD Initiated Contact with Patient 11/02/14 1647     Chief Complaint  Patient presents with  . Groin Swelling     (Consider location/radiation/quality/duration/timing/severity/associated sxs/prior Treatment) HPI Patient presents to the emergency department with swelling to the shaft of his penis.  The patient states that it started yesterday and got worse today.  He was seen at his primary care doctor's office who sent him to the emergency department.  The patient states that he did not take any medications for this.  Patient states that he has not had any new soaps, lotions, detergents, clothing, or other items that would be in contact with this area.  Patient denies penile discharge, dysuria, abdominal pain, fever, nausea, vomiting, diarrhea, weakness, dizziness, back pain, or syncope.  The patient states that nothing seems to make his condition, better or worse Past Medical History  Diagnosis Date  . DIABETES MELLITUS, TYPE II 04/12/2008  . GLUCOSE INTOLERANCE 03/12/2008  . HYPERLIPIDEMIA 04/12/2008  . ANEMIA-NOS 10/03/2010  . HYPERTENSION 03/12/2008  . PSA, INCREASED 03/18/2008  . PROSTATE CANCER, HX OF 09/05/2009  . Gout   . Cancer     prostate   Past Surgical History  Procedure Laterality Date  . Negative stress echo  Oct. 17, 2011    [facility]  . Colonoscopy    . Prostate surgery  2009   Family History  Problem Relation Age of Onset  . Cancer Mother     carcinoid tumor 1991,  . Liver disease Mother     s/p partial liver resection  . Hypertension Other   . Diabetes Other   . Colon cancer Paternal Aunt   . Rectal cancer Neg Hx   . Stomach cancer Neg Hx    History  Substance Use Topics  . Smoking status: Former Smoker  . Smokeless tobacco: Never Used  . Alcohol Use: No    Review of Systems  All other systems negative except as documented in the HPI. All pertinent positives and  negatives as reviewed in the HPI.  Allergies  Review of patient's allergies indicates no known allergies.  Home Medications   Prior to Admission medications   Medication Sig Start Date End Date Taking? Authorizing Provider  aspirin 81 MG EC tablet Take 81 mg by mouth daily.     Yes Historical Provider, MD  Calcium-Vitamin D 500-125 MG-UNIT TABS Take 1 tablet by mouth daily.    Yes Historical Provider, MD  glimepiride (AMARYL) 1 MG tablet Take 1 tablet (1 mg total) by mouth daily with breakfast. 06/22/14  Yes James W John, MD  glucose blood (FREESTYLE LITE) test strip 1 each by Other route. Use as directed two times daily    Yes Historical Provider, MD  HYDROcodone-acetaminophen (NORCO) 10-325 MG per tablet Take 1 tablet by mouth every 6 (six) hours as needed. 08/24/14  Yes James W John, MD  Lancets (FREESTYLE) lancets 1 each by Other route 2 (two) times daily. Use as instructed    Yes Historical Provider, MD  metFORMIN (GLUCOPHAGE) 1000 MG tablet Take 1 tablet (1,000 mg total) by mouth 2 (two) times daily with a meal. 06/22/14  Yes James W John, MD  Multiple Vitamin (MULTIVITAMIN) tablet Take 1 tablet by mouth daily.   Yes Historical Provider, MD  Na Sulfate-K Sulfate-Mg Sulf (SUPREP BOWEL PREP) SOLN Take 1 kit by mouth once. suprep as directed. No substitutions 09/30/14    Yes Inda Castle, MD  Olmesartan-Amlodipine-HCTZ [facility]) 20-5-12.5 MG TABS Take 1 tablet by mouth 2 (two) times daily. Patient taking differently: Take 2 tablets by mouth daily with breakfast.  06/22/14  Yes [name], MD  pioglitazone (ACTOS) 45 MG tablet Take 1 tablet (45 mg total) by mouth daily. 06/22/14  Yes [name], MD  simvastatin (ZOCOR) 20 MG tablet Take 1 tablet (20 mg total) by mouth daily at 6 PM. Patient taking differently: Take 20 mg by mouth daily with breakfast.  06/22/14  Yes [name], MD   BP 128/81 mmHg  Pulse 73  Temp(Src) 98.7 F (37.1 C) (Oral)  Resp 18  SpO2 99% Physical Exam    Constitutional: He is oriented to person, place, and time. He appears well-developed and well-nourished. No distress.  HENT:  Head: Normocephalic and atraumatic.  Mouth/Throat: Oropharynx is clear and moist.  Cardiovascular: Normal rate, regular rhythm and normal heart sounds.  Exam reveals no gallop and no friction rub.   No murmur heard. Pulmonary/Chest: Effort normal and breath sounds normal.  Genitourinary:     Neurological: He is alert and oriented to person, place, and time. Coordination normal.  Skin: Skin is warm and dry. No rash noted. No erythema.  Nursing note and vitals reviewed.   ED Course  Procedures (including critical care time) I spoke with Dr. Matilde Sprang who advised to have him follow-up with Dr. Risa Grill.  He also advised to place him on Keflex, even though he does not feel this is most likely infectious.  The patient is advised return here as needed.  Told to elevate the area.    Brent General, PA-C 11/02/14 1845  Johnna Acosta, MD 11/03/14 [phone]

## 2014-11-29 ENCOUNTER — Encounter (HOSPITAL_COMMUNITY)
Admission: RE | Disposition: A | Discharge status: Home / Self Care | Payer: Self-pay | Source: Ambulatory Visit | Attending: Gastroenterology

## 2014-11-29 ENCOUNTER — Ambulatory Visit (HOSPITAL_COMMUNITY)
Admission: RE | Admit: 2014-11-29 | Discharge: 2014-11-29 | Disposition: A | Discharge status: Home / Self Care | Payer: BC Managed Care – PPO | Source: Ambulatory Visit | Attending: Gastroenterology | Admitting: Gastroenterology

## 2014-11-29 ENCOUNTER — Encounter (HOSPITAL_COMMUNITY): Payer: Self-pay

## 2014-11-29 DIAGNOSIS — D649 Anemia, unspecified: Secondary | ICD-10-CM | POA: Insufficient documentation

## 2014-11-29 DIAGNOSIS — E119 Type 2 diabetes mellitus without complications: Secondary | ICD-10-CM | POA: Diagnosis not present

## 2014-11-29 DIAGNOSIS — Z8546 Personal history of malignant neoplasm of prostate: Secondary | ICD-10-CM | POA: Insufficient documentation

## 2014-11-29 DIAGNOSIS — Z1211 Encounter for screening for malignant neoplasm of colon: Secondary | ICD-10-CM

## 2014-11-29 DIAGNOSIS — I10 Essential (primary) hypertension: Secondary | ICD-10-CM | POA: Diagnosis not present

## 2014-11-29 DIAGNOSIS — E7439 Other disorders of intestinal carbohydrate absorption: Secondary | ICD-10-CM | POA: Diagnosis not present

## 2014-11-29 DIAGNOSIS — Z87891 Personal history of nicotine dependence: Secondary | ICD-10-CM | POA: Diagnosis not present

## 2014-11-29 DIAGNOSIS — K5730 Diverticulosis of large intestine without perforation or abscess without bleeding: Secondary | ICD-10-CM | POA: Insufficient documentation

## 2014-11-29 DIAGNOSIS — M109 Gout, unspecified: Secondary | ICD-10-CM | POA: Insufficient documentation

## 2014-11-29 DIAGNOSIS — E785 Hyperlipidemia, unspecified: Secondary | ICD-10-CM | POA: Insufficient documentation

## 2014-11-29 HISTORY — PX: COLONOSCOPY: SHX5424

## 2014-11-29 LAB — GLUCOSE, CAPILLARY: GLUCOSE-CAPILLARY: 112 mg/dL — AB (ref 70–99)

## 2014-11-29 SURGERY — COLONOSCOPY
Anesthesia: Moderate Sedation

## 2014-11-29 MED ORDER — MIDAZOLAM HCL 5 MG/5ML IJ SOLN
INTRAMUSCULAR | Status: DC | PRN
  Administered 2014-11-29: 1 mg via INTRAVENOUS
  Administered 2014-11-29 (×2): 2 mg via INTRAVENOUS

## 2014-11-29 MED ORDER — SODIUM CHLORIDE 0.9 % IV SOLN: INTRAVENOUS | Status: DC

## 2014-11-29 MED ORDER — FENTANYL CITRATE 0.05 MG/ML IJ SOLN
INTRAMUSCULAR | Status: DC | PRN
  Administered 2014-11-29 (×3): 25 ug via INTRAVENOUS

## 2014-11-29 MED ORDER — SODIUM CHLORIDE 0.9 % IV SOLN
INTRAVENOUS | Status: DC
  Administered 2014-11-29: 500 mL via INTRAVENOUS

## 2014-11-29 MED ORDER — MIDAZOLAM HCL 10 MG/2ML IJ SOLN
INTRAMUSCULAR | Status: AC
  Filled 2014-11-29: qty 2

## 2014-11-29 MED ORDER — FENTANYL CITRATE 0.05 MG/ML IJ SOLN
INTRAMUSCULAR | Status: AC
  Filled 2014-11-29: qty 2

## 2014-11-29 NOTE — H&P (Signed)
               _                                                                                                                History of Present Illness:  Mr. Frampton is a 63 yo AA male here for screening colonoscopy.  He has no GI complaints.   Past Medical History  Diagnosis Date  . DIABETES MELLITUS, TYPE II 04/12/2008  . GLUCOSE INTOLERANCE 03/12/2008  . HYPERLIPIDEMIA 04/12/2008  . ANEMIA-NOS 10/03/2010  . HYPERTENSION 03/12/2008  . PSA, INCREASED 03/18/2008  . PROSTATE CANCER, HX OF 09/05/2009  . Gout   . Cancer     prostate   Past Surgical History  Procedure Laterality Date  . Negative stress echo  Oct. 17, 2011    [facility]  . Colonoscopy    . Prostate surgery  2009   family history includes Cancer in his mother; Colon cancer in his paternal aunt; Diabetes in his other; Hypertension in his other; Liver disease in his mother. There is no history of Rectal cancer or Stomach cancer. Current Facility-Administered Medications  Medication Dose Route Frequency Provider Last Rate Last Dose  . 0.9 %  sodium chloride infusion   Intravenous Continuous Inda Castle, MD 20 mL/hr at 11/29/14 0843 500 mL at 11/29/14 0843  . 0.9 %  sodium chloride infusion   Intravenous Continuous Inda Castle, MD       Allergies as of 10/28/2014  . (No Known Allergies)    reports that he has quit smoking. He has never used smokeless tobacco. He reports that he does not drink alcohol or use illicit drugs.   Review of Systems: Pertinent positive and negative review of systems were noted in the above HPI section. All other review of systems were otherwise negative.  Vital signs were reviewed in today's medical record Physical Exam: General: Well developed , well nourished, no acute distress Skin: anicteric Head: Normocephalic and atraumatic Eyes:  sclerae anicteric, EOMI Ears: Normal auditory acuity Mouth: No deformity or lesions Neck: Supple, no masses or thyromegaly Lungs:  Clear throughout to auscultation Heart: Regular rate and rhythm; no murmurs, rubs or bruits Abdomen: Soft, non tender and non distended. No masses, hepatosplenomegaly or hernias noted. Normal Bowel sounds Rectal:deferred Musculoskeletal: Symmetrical with no gross deformities  Skin: No lesions on visible extremities Pulses:  Normal pulses noted Extremities: No clubbing, cyanosis, edema or deformities noted Neurological: Alert oriented x 4, grossly nonfocal Cervical Nodes:  No significant cervical adenopathy Inguinal Nodes: No significant inguinal adenopathy Psychological:  Alert and cooperative. Normal mood and affect  See Assessment and Plan  1.  DM 2.  H/o prostate Ca  Plan screening colonoscopy

## 2014-11-29 NOTE — Discharge Instructions (Signed)

## 2014-11-29 NOTE — Op Note (Signed)
[facility] [place], [address]   COLONOSCOPY PROCEDURE REPORT  PATIENT: Marcus Rivera, Marcus Rivera  MR#: [id number] BIRTHDATE: 1951/10/26 , 47  yrs. old GENDER: male ENDOSCOPIST: Inda Castle, MD REFERRED [id number] John, M.D. PROCEDURE DATE:  11/29/2014 PROCEDURE:   Colonoscopy, diagnostic First Screening Colonoscopy - Avg.  risk and is 50 yrs.  old or older - No.  Prior Negative Screening - Now for repeat screening. 10 or more years since last screening  History of Adenoma - Now for follow-up colonoscopy & has been > or = to 3 yrs.  N/A  Polyps Removed Today? No.  Recommend repeat exam, <10 yrs? No. ASA CLASS:   Class II INDICATIONS:average risk for colon cancer. MEDICATIONS: Versed 5 mg IV and Fentanyl 75 mcg IV  DESCRIPTION OF PROCEDURE:   After the risks benefits and alternatives of the procedure were thoroughly explained, informed consent was obtained.  The digital rectal exam revealed no abnormalities of the rectum.   The EC-[id number] ([id number])  endoscope was introduced through the anus and advanced to the cecum, which was identified by both the appendix and ileocecal valve. No adverse events experienced.   The quality of the prep was Suprep good  The instrument was then slowly withdrawn as the colon was fully examined.      COLON FINDINGS: There was mild diverticulosis noted in the sigmoid colon.   The examination was otherwise normal.  Retroflexed views revealed no abnormalities. The time to cecum=3 minutes 0 seconds. Withdrawal time=12 minutes 0 seconds.  The scope was withdrawn and the procedure completed. COMPLICATIONS: There were no immediate complications.  ENDOSCOPIC IMPRESSION: 1.   Mild diverticulosis was noted in the sigmoid colon 2.   The examination was otherwise normal  RECOMMENDATIONS: Continue current colorectal screening recommendations for "routine risk" patients with a repeat colonoscopy in 10 years.  eSigned:  Inda Castle, MD 11/29/2014 10:03 AM   cc:

## 2014-11-30 ENCOUNTER — Encounter (HOSPITAL_COMMUNITY): Payer: Self-pay | Admitting: Gastroenterology

## 2014-12-23 ENCOUNTER — Ambulatory Visit: Payer: BC Managed Care – PPO | Admitting: Internal Medicine

## 2014-12-23 ENCOUNTER — Telehealth: Payer: Self-pay | Admitting: Internal Medicine

## 2014-12-23 MED ORDER — PREDNISONE 10 MG PO TABS: 20.0000 mg | ORAL_TABLET | Freq: Every day | ORAL | Status: DC

## 2014-12-23 NOTE — Telephone Encounter (Signed)
Would like a script for gout flair up to be sent to [facility] on [address].

## 2014-12-23 NOTE — Telephone Encounter (Signed)
Attempted to call to inform patient sent in but no answer and mailbox full

## 2014-12-23 NOTE — Telephone Encounter (Signed)
Done erx 

## 2015-03-08 IMAGING — CR DG KNEE COMPLETE 4+V*R*
4 series · 4 of 4 positions shown · non-contrast
Comparison: None.

CLINICAL DATA: Right knee pain.

EXAM:
RIGHT KNEE - COMPLETE 4+ VIEW

[view not recorded (1 of 4)]
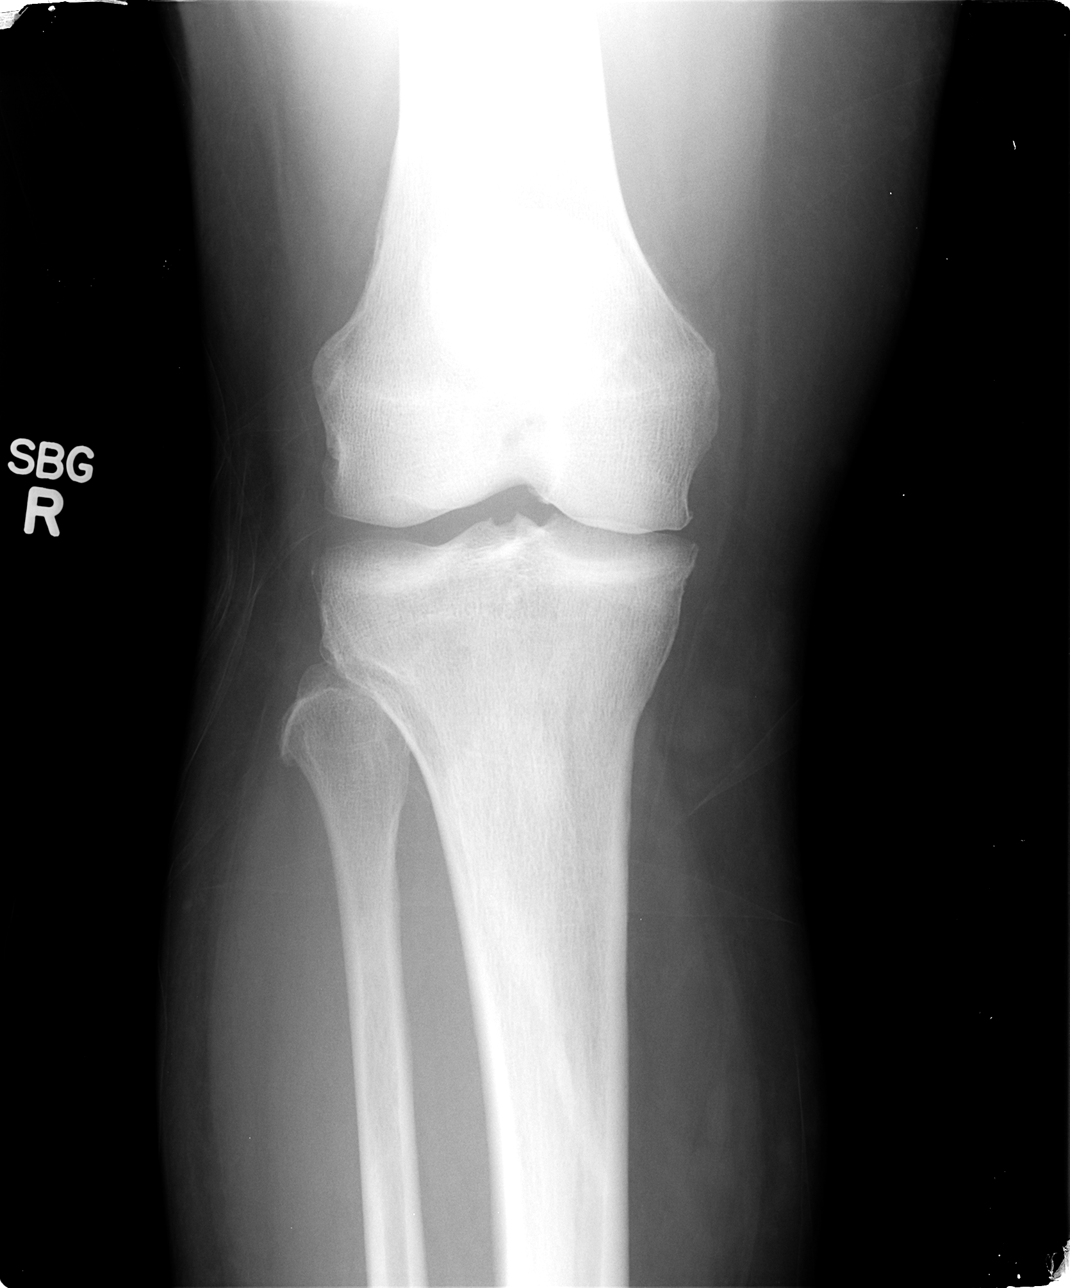

[view not recorded (2 of 4)]
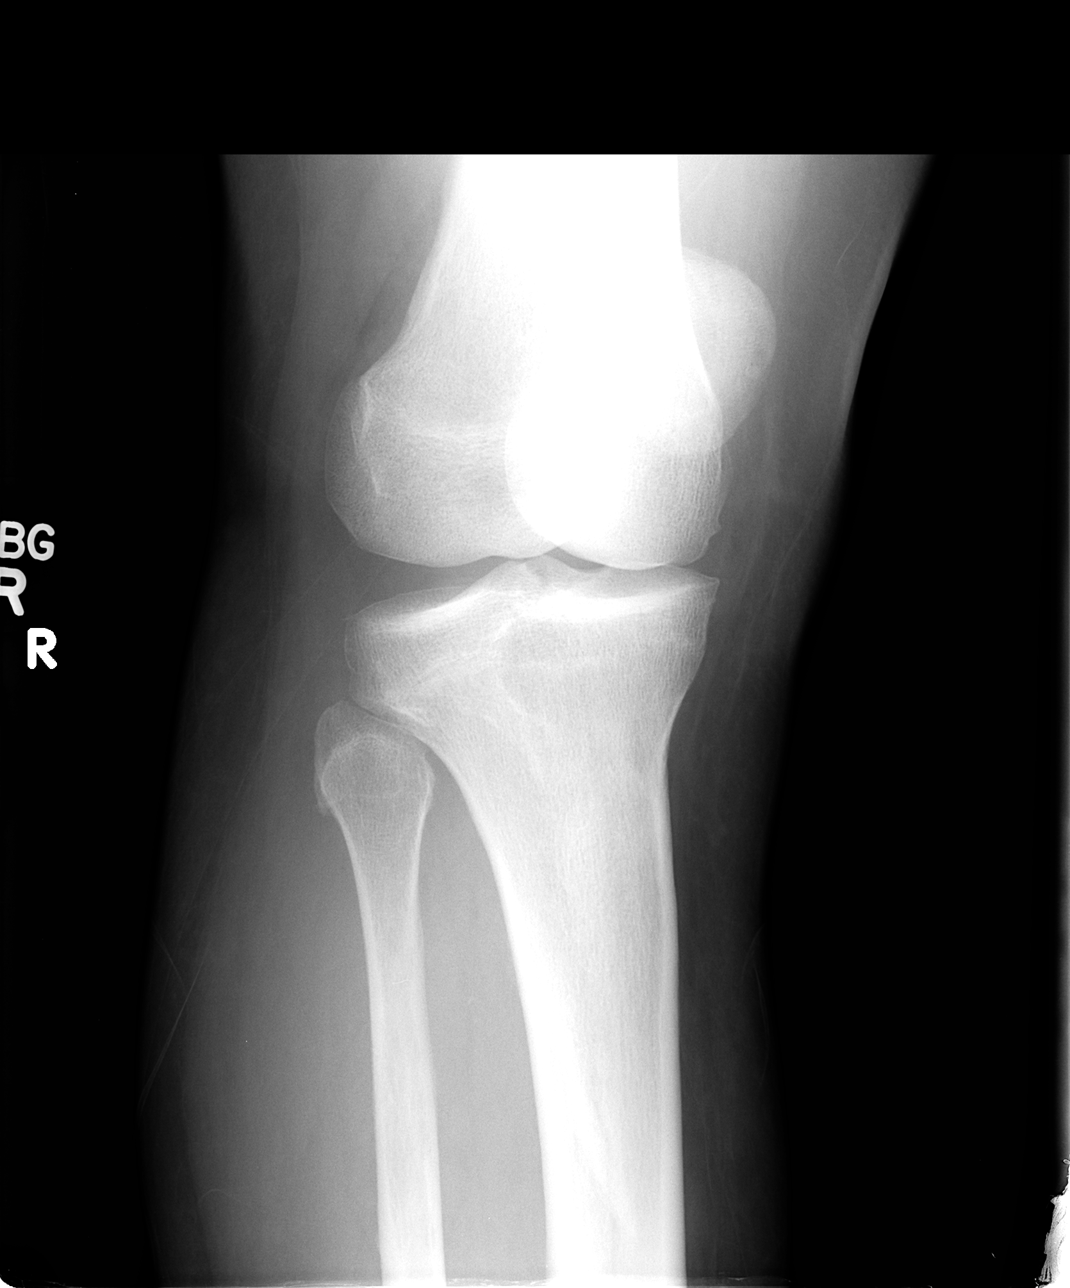

[view not recorded (3 of 4)]
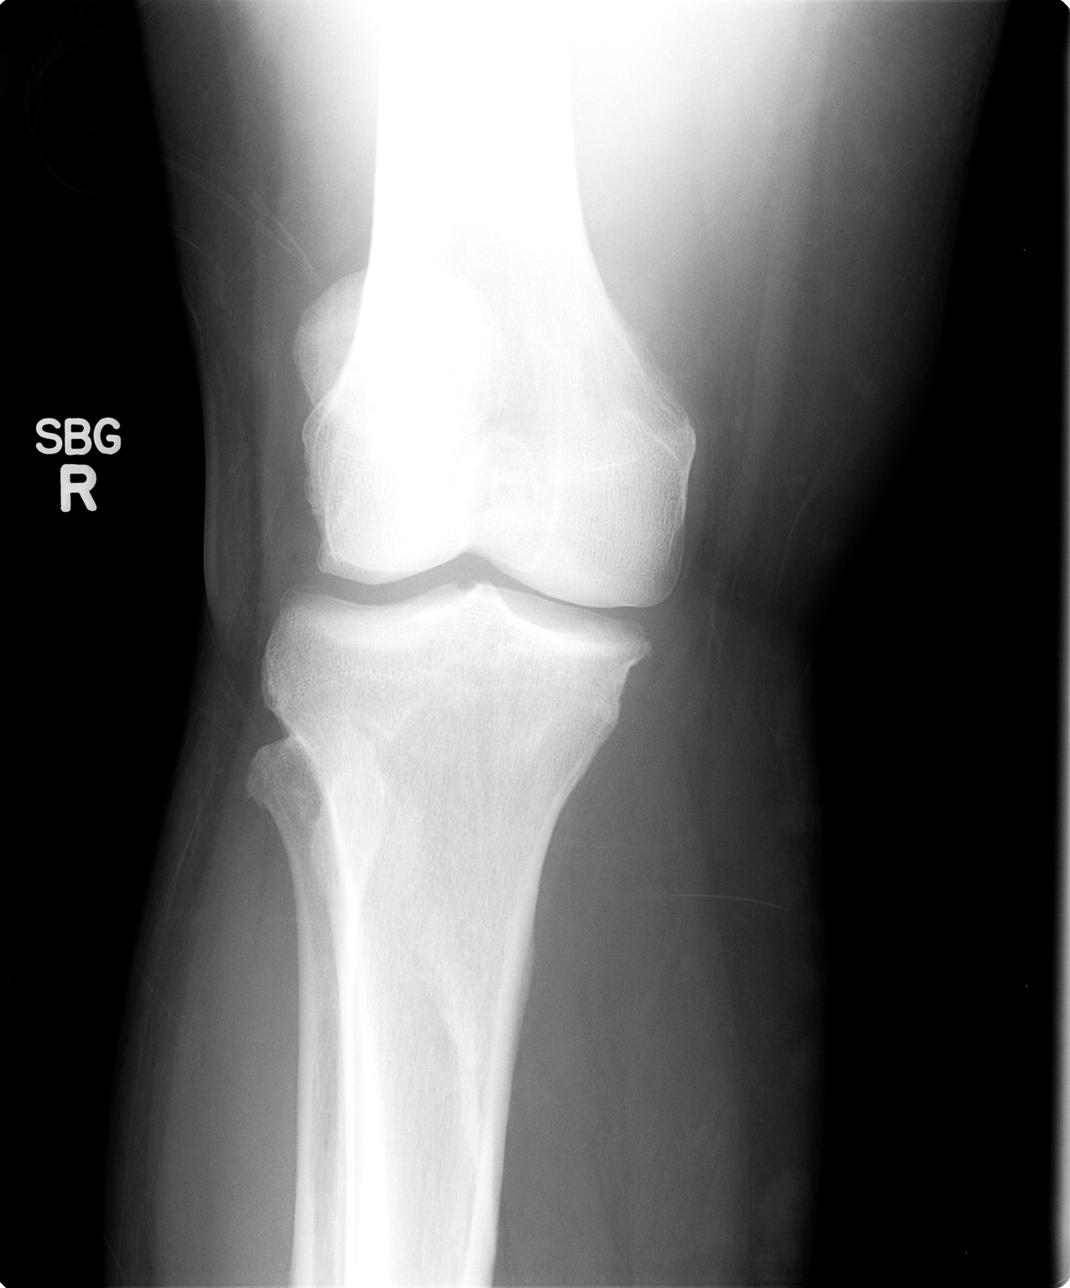

[view not recorded (4 of 4)]
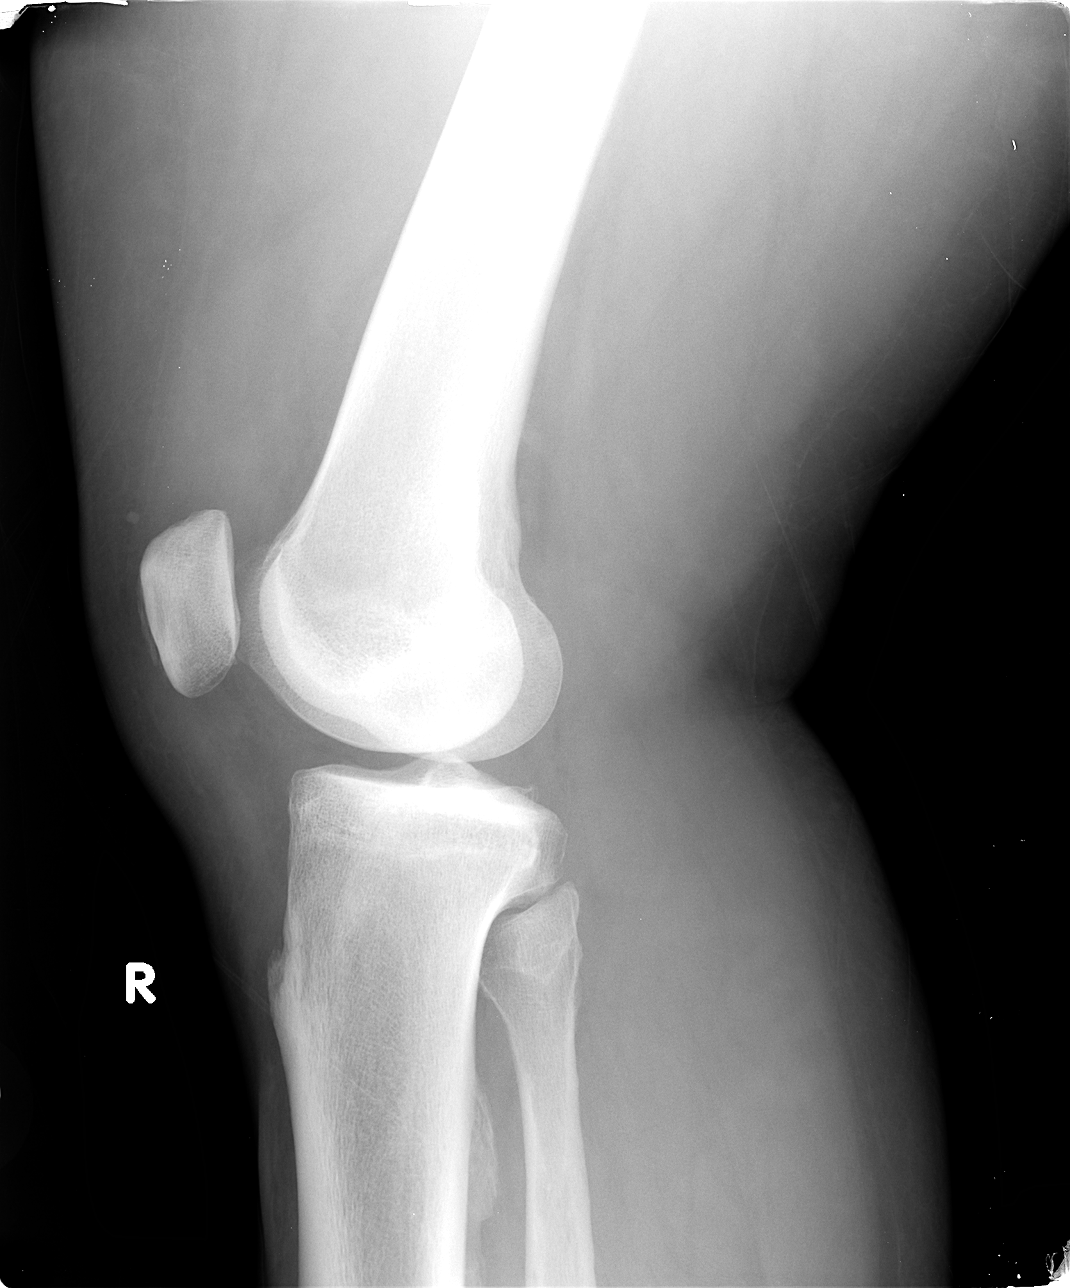

[4 of 4 positions shown; findings below may reference images not displayed]

FINDINGS: There is no evidence of fracture, dislocation, or joint effusion.
Mild narrowing of medial joint space is noted. Soft tissues are
unremarkable.
IMPRESSION: Mild degenerative joint disease is noted medially. No acute
abnormality seen in the right knee.

## 2015-06-29 ENCOUNTER — Other Ambulatory Visit: Payer: Self-pay | Admitting: Internal Medicine

## 2015-10-28 ENCOUNTER — Other Ambulatory Visit: Payer: Self-pay | Admitting: Internal Medicine

## 2016-02-06 ENCOUNTER — Other Ambulatory Visit: Payer: Self-pay | Admitting: Internal Medicine

## 2016-03-02 ENCOUNTER — Other Ambulatory Visit (INDEPENDENT_AMBULATORY_CARE_PROVIDER_SITE_OTHER): Payer: BLUE CROSS/BLUE SHIELD

## 2016-03-02 ENCOUNTER — Encounter: Payer: Self-pay | Admitting: Internal Medicine

## 2016-03-02 ENCOUNTER — Ambulatory Visit (INDEPENDENT_AMBULATORY_CARE_PROVIDER_SITE_OTHER): Payer: BLUE CROSS/BLUE SHIELD | Admitting: Internal Medicine

## 2016-03-02 ENCOUNTER — Other Ambulatory Visit: Payer: Self-pay | Admitting: Internal Medicine

## 2016-03-02 VITALS — BP 136/90 | HR 68 | Temp 98.4°F | Resp 20 | Wt 268.0 lb

## 2016-03-02 DIAGNOSIS — Z0000 Encounter for general adult medical examination without abnormal findings: Secondary | ICD-10-CM | POA: Diagnosis not present

## 2016-03-02 DIAGNOSIS — E785 Hyperlipidemia, unspecified: Secondary | ICD-10-CM | POA: Diagnosis not present

## 2016-03-02 DIAGNOSIS — D649 Anemia, unspecified: Secondary | ICD-10-CM | POA: Diagnosis not present

## 2016-03-02 DIAGNOSIS — Z1159 Encounter for screening for other viral diseases: Secondary | ICD-10-CM

## 2016-03-02 DIAGNOSIS — I10 Essential (primary) hypertension: Secondary | ICD-10-CM

## 2016-03-02 DIAGNOSIS — E119 Type 2 diabetes mellitus without complications: Secondary | ICD-10-CM

## 2016-03-02 DIAGNOSIS — Z8546 Personal history of malignant neoplasm of prostate: Secondary | ICD-10-CM | POA: Diagnosis not present

## 2016-03-02 LAB — CBC WITH DIFFERENTIAL/PLATELET
BASOS ABS: 0 10*3/uL (ref 0.0–0.1)
Basophils Relative: 0.4 % (ref 0.0–3.0)
EOS ABS: 0.1 10*3/uL (ref 0.0–0.7)
Eosinophils Relative: 2.1 % (ref 0.0–5.0)
HCT: 36 % — ABNORMAL LOW (ref 39.0–52.0)
Hemoglobin: 12 g/dL — ABNORMAL LOW (ref 13.0–17.0)
LYMPHS ABS: 0.9 10*3/uL (ref 0.7–4.0)
Lymphocytes Relative: 26.9 % (ref 12.0–46.0)
MCHC: 33.3 g/dL (ref 30.0–36.0)
MCV: 78.3 fl (ref 78.0–100.0)
MONO ABS: 0.4 10*3/uL (ref 0.1–1.0)
MONOS PCT: 12.1 % — AB (ref 3.0–12.0)
NEUTROS ABS: 1.9 10*3/uL (ref 1.4–7.7)
NEUTROS PCT: 58.5 % (ref 43.0–77.0)
PLATELETS: 251 10*3/uL (ref 150.0–400.0)
RBC: 4.6 Mil/uL (ref 4.22–5.81)
RDW: 14.5 % (ref 11.5–15.5)
WBC: 3.2 10*3/uL — ABNORMAL LOW (ref 4.0–10.5)

## 2016-03-02 LAB — URINALYSIS, ROUTINE W REFLEX MICROSCOPIC
BILIRUBIN URINE: NEGATIVE
HGB URINE DIPSTICK: NEGATIVE
KETONES UR: NEGATIVE
LEUKOCYTES UA: NEGATIVE
NITRITE: NEGATIVE
PH: 7 (ref 5.0–8.0)
Specific Gravity, Urine: 1.015 (ref 1.000–1.030)
TOTAL PROTEIN, URINE-UPE24: NEGATIVE
UROBILINOGEN UA: 0.2 (ref 0.0–1.0)
Urine Glucose: NEGATIVE

## 2016-03-02 LAB — VITAMIN B12: Vitamin B-12: 651 pg/mL (ref 211–911)

## 2016-03-02 LAB — HEPATIC FUNCTION PANEL
ALBUMIN: 4.3 g/dL (ref 3.5–5.2)
ALT: 18 U/L (ref 0–53)
AST: 17 U/L (ref 0–37)
Alkaline Phosphatase: 47 U/L (ref 39–117)
Bilirubin, Direct: 0.1 mg/dL (ref 0.0–0.3)
Total Bilirubin: 0.4 mg/dL (ref 0.2–1.2)
Total Protein: 7.2 g/dL (ref 6.0–8.3)

## 2016-03-02 LAB — LIPID PANEL
CHOLESTEROL: 205 mg/dL — AB (ref 0–200)
HDL: 72.9 mg/dL (ref >39.00)
LDL CALC: 117 mg/dL — AB (ref 0–99)
NonHDL: 132.34
Total CHOL/HDL Ratio: 3
Triglycerides: 76 mg/dL (ref 0.0–149.0)
VLDL: 15.2 mg/dL (ref 0.0–40.0)

## 2016-03-02 LAB — IBC PANEL
Iron: 69 ug/dL (ref 42–165)
Saturation Ratios: 17.8 % — ABNORMAL LOW (ref 20.0–50.0)
Transferrin: 277 mg/dL (ref 212.0–360.0)

## 2016-03-02 LAB — MICROALBUMIN / CREATININE URINE RATIO
Creatinine,U: 74.8 mg/dL
MICROALB UR: 0.8 mg/dL (ref 0.0–1.9)
Microalb Creat Ratio: 1.1 mg/g (ref 0.0–30.0)

## 2016-03-02 LAB — BASIC METABOLIC PANEL
BUN: 18 mg/dL (ref 6–23)
CALCIUM: 9.9 mg/dL (ref 8.4–10.5)
CO2: 33 mEq/L — ABNORMAL HIGH (ref 19–32)
CREATININE: 1.12 mg/dL (ref 0.40–1.50)
Chloride: 103 mEq/L (ref 96–112)
GFR: 84.85 mL/min (ref >60.00)
GLUCOSE: 93 mg/dL (ref 70–99)
Potassium: 4.8 mEq/L (ref 3.5–5.1)
SODIUM: 142 meq/L (ref 135–145)

## 2016-03-02 LAB — TSH: TSH: 1.17 u[IU]/mL (ref 0.35–4.50)

## 2016-03-02 LAB — HEPATITIS C ANTIBODY: HCV Ab: NEGATIVE

## 2016-03-02 LAB — HEMOGLOBIN A1C: Hgb A1c MFr Bld: 7.1 % — ABNORMAL HIGH (ref 4.6–6.5)

## 2016-03-02 LAB — PSA: PSA: 1.12 ng/mL (ref 0.10–4.00)

## 2016-03-02 MED ORDER — SIMVASTATIN 40 MG PO TABS: 40.0000 mg | ORAL_TABLET | Freq: Every day | ORAL | Status: DC

## 2016-03-02 MED ORDER — FREESTYLE LANCETS MISC: Status: DC

## 2016-03-02 MED ORDER — SIMVASTATIN 20 MG PO TABS: ORAL_TABLET | ORAL | Status: DC

## 2016-03-02 MED ORDER — METFORMIN HCL 1000 MG PO TABS: 1000.0000 mg | ORAL_TABLET | Freq: Two times a day (BID) | ORAL | Status: DC

## 2016-03-02 MED ORDER — PIOGLITAZONE HCL 45 MG PO TABS
ORAL_TABLET | ORAL | Status: DC
Start: 2016-03-02 — End: 2016-11-22

## 2016-03-02 MED ORDER — GLUCOSE BLOOD VI STRP: ORAL_STRIP | Status: DC

## 2016-03-02 MED ORDER — GLIMEPIRIDE 1 MG PO TABS: 1.0000 mg | ORAL_TABLET | Freq: Every day | ORAL | Status: DC

## 2016-03-02 MED ORDER — COLCHICINE 0.6 MG PO TABS
0.6000 mg | ORAL_TABLET | Freq: Two times a day (BID) | ORAL | Status: DC | PRN
Start: 2016-03-02 — End: 2017-09-06

## 2016-03-02 NOTE — Assessment & Plan Note (Signed)
Also for f/u psa per pt request

## 2016-03-02 NOTE — Progress Notes (Signed)
Subjective:    Patient ID: Marcus Rivera, male    DOB: [birth year], 65 y.o.   MRN: [id number]  HPI  Here for wellness and f/u;  Overall doing ok;  Pt denies Chest pain, worsening SOB, DOE, wheezing, orthopnea, PND, worsening LE edema, palpitations, dizziness or syncope.  Pt denies neurological change such as new headache, facial or extremity weakness.  Pt denies polydipsia, polyuria, or low sugar symptoms. Pt states overall good compliance with treatment and medications, good tolerability, and has been trying to follow appropriate diet.  Pt denies worsening depressive symptoms, suicidal ideation or panic. No fever, night sweats, wt loss, loss of appetite, or other constitutional symptoms.  Pt states good ability with ADL's, has low fall risk, home safety reviewed and adequate, no other significant changes in hearing or vision, and only occasionally active with exercise.  Did have a fall with a misstep going down stairs, right knee went lateral during the fall, had significant right knee and lateral upper leg swelling, and right ankle pain, now swelling resolved but still has a pop to the right knee occasionaly, no pain or swelling  Saw Dr Tamala Julian dec 2015 with gastroc tear, right knee ok,  Has overall been less active, may have gained 30 lbs rf more? Per pt. Wt Readings from Last 3 Encounters:  03/02/16 268 lb (121.564 kg)  11/29/14 220 lb (99.791 kg)  11/02/14 239 lb (108.41 kg)  Asks for gout med refill as ran out since lat flare up, usually happens every year or so.  Past Medical History  Diagnosis Date  . DIABETES MELLITUS, TYPE II 04/12/2008  . GLUCOSE INTOLERANCE 03/12/2008  . HYPERLIPIDEMIA 04/12/2008  . ANEMIA-NOS 10/03/2010  . HYPERTENSION 03/12/2008  . PSA, INCREASED 03/18/2008  . PROSTATE CANCER, HX OF 09/05/2009  . Gout   . Cancer [facility])     prostate   Past Surgical History  Procedure Laterality Date  . Negative stress echo  Oct. 17, 2011    [facility]  . Colonoscopy    .  Prostate surgery  2009  . Colonoscopy N/A 11/29/2014    Procedure: COLONOSCOPY;  Surgeon: Inda Castle, MD;  Location: [facility];  Service: Endoscopy;  Laterality: N/A;    reports that he has quit smoking. He has never used smokeless tobacco. He reports that he does not drink alcohol or use illicit drugs. family history includes Cancer in his mother; Colon cancer in his paternal aunt; Diabetes in his other; Hypertension in his other; Liver disease in his mother. There is no history of Rectal cancer or Stomach cancer. No Known Allergies Current Outpatient Prescriptions on File Prior to Visit  Medication Sig Dispense Refill  . aspirin 81 MG EC tablet Take 81 mg by mouth daily.      . Calcium-Vitamin D 500-125 MG-UNIT TABS Take 1 tablet by mouth daily.     [name] glimepiride (AMARYL) 1 MG tablet Take 1 tablet (1 mg total) by mouth daily with breakfast. 90 tablet 3  . glucose blood (FREESTYLE LITE) test strip 1 each by Other route. Use as directed two times daily     . Lancets (FREESTYLE) lancets 1 each by Other route 2 (two) times daily. Use as instructed     . metFORMIN (GLUCOPHAGE) 1000 MG tablet Take 1 tablet (1,000 mg total) by mouth 2 (two) times daily with a meal. 180 tablet 3  . Multiple Vitamin (MULTIVITAMIN) tablet Take 1 tablet by mouth daily.    . pioglitazone (ACTOS)  45 MG tablet TAKE 1 TABLET DAILY (APPOINTMENT IS REQUIRED FOR ANY FURTHER REFILLS) 90 tablet 0  . simvastatin (ZOCOR) 20 MG tablet TAKE 1 TABLET DAILY AT 6PM 90 tablet 2   No current facility-administered medications on file prior to visit.    Review of Systems Constitutional: Negative for increased diaphoresis, other activity, appetite or siginficant weight change other than noted HENT: Negative for worsening hearing loss, ear pain, facial swelling, mouth sores and neck stiffness.   Eyes: Negative for other worsening pain, redness or visual disturbance.  Respiratory: Negative for shortness of breath and wheezing    Cardiovascular: Negative for chest pain and palpitations.  Gastrointestinal: Negative for diarrhea, blood in stool, abdominal distention or other pain Genitourinary: Negative for hematuria, flank pain or change in urine volume.  Musculoskeletal: Negative for myalgias or other joint complaints.  Skin: Negative for color change and wound or drainage.  Neurological: Negative for syncope and numbness. other than noted Hematological: Negative for adenopathy. or other swelling Psychiatric/Behavioral: Negative for hallucinations, SI, self-injury, decreased concentration or other worsening agitation.      Objective:   Physical Exam BP 136/90 mmHg  Pulse 68  Temp(Src) 98.4 F (36.9 C) (Oral)  Resp 20  Wt 268 lb (121.564 kg)  SpO2 92% VS noted,  Constitutional: Pt is oriented to person, place, and time. Appears well-developed and well-nourished, in no significant distress Head: Normocephalic and atraumatic.  Right Ear: External ear normal.  Left Ear: External ear normal.  Nose: Nose normal.  Mouth/Throat: Oropharynx is clear and moist.  Eyes: Conjunctivae and EOM are normal. Pupils are equal, round, and reactive to light.  Neck: Normal range of motion. Neck supple. No JVD present. No tracheal deviation present or significant neck LA or mass Cardiovascular: Normal rate, regular rhythm, normal heart sounds and intact distal pulses.   Pulmonary/Chest: Effort normal and breath sounds without rales or wheezing  Abdominal: Soft. Bowel sounds are normal. NT. No HSM  Musculoskeletal: Normal range of motion. Exhibits no edema.  Lymphadenopathy:  Has no cervical adenopathy.  Neurological: Pt is alert and oriented to person, place, and time. Pt has normal reflexes. No cranial nerve deficit. Motor grossly intact Skin: Skin is warm and dry. No rash noted.  Psychiatric:  Has normal mood and affect. Behavior is normal.     Assessment & Plan:

## 2016-03-02 NOTE — Assessment & Plan Note (Addendum)

## 2016-03-02 NOTE — Assessment & Plan Note (Signed)
Chronic persistent, also for f/u iron/b12,  to f/u any worsening symptoms or concerns

## 2016-03-02 NOTE — Patient Instructions (Addendum)
You had the new Prevnar 13 pneumonia shot  Your EKG was [facility] today  Please continue all other medications as before, and refills have been done if requested.  Please have the pharmacy call with any other refills you may need.  Please continue your efforts at being more active, low cholesterol diet, and weight control.  You are otherwise up to date with prevention measures today.  Please keep your appointments with your specialists as you may have planned  Please go to the LAB in the Basement (turn left off the elevator) for the tests to be done today  You will be contacted by phone if any changes need to be made immediately.  Otherwise, you will receive a letter about your results with an explanation, but please check with MyChart first.  Please remember to sign up for MyChart if you have not done so, as this will be important to you in the future with finding out test results, communicating by private email, and scheduling acute appointments online when needed.  Please return in 6 months, or sooner if needed, with Lab testing done 3-5 days before

## 2016-03-02 NOTE — Assessment & Plan Note (Signed)
/  stable overall by history and exam, recent data reviewed with pt, and pt to continue medical treatment as before,  to f/u any worsening symptoms or concerns / BP Readings from Last 3 Encounters:  03/02/16 136/90  11/29/14 122/78  11/02/14 149/92

## 2016-03-02 NOTE — Assessment & Plan Note (Signed)
stable overall by history and exam, recent data reviewed with pt, and pt to continue medical treatment as before,  to f/u any worsening symptoms or concerns Lab Results  Component Value Date   LDLCALC 78 06/22/2014

## 2016-03-02 NOTE — Assessment & Plan Note (Signed)
Possibly uncontrolled due to recent wt gain, o/w stable overall by history and exam, recent data reviewed with pt, and pt to continue medical treatment as before,  to f/u any worsening symptoms or concerns, suspect may have to adjust meds or start insulin due to wt gain, but will need f/u labs Lab Results  Component Value Date   HGBA1C 6.5 06/22/2014

## 2016-03-02 NOTE — Progress Notes (Signed)
Pre visit review using our clinic review tool, if applicable. No additional management support is needed unless otherwise documented below in the visit note. 

## 2016-03-06 ENCOUNTER — Telehealth: Payer: Self-pay | Admitting: *Deleted

## 2016-03-06 MED ORDER — ONETOUCH ULTRASOFT LANCETS MISC: 1.0000 | Freq: Two times a day (BID) | Status: DC

## 2016-03-06 MED ORDER — GLUCOSE BLOOD VI STRP: 1.0000 | ORAL_STRIP | Freq: Two times a day (BID) | Status: DC

## 2016-03-06 MED ORDER — ONETOUCH ULTRA 2 W/DEVICE KIT: PACK | Status: DC

## 2016-03-06 NOTE — Telephone Encounter (Signed)
Left msg on triage stating insurance doesn't cover Freestyle products. Will cover one touch ultra blue. When calling back use ref# [id number]. Sent updated script electronically...Johny Chess

## 2016-03-12 ENCOUNTER — Telehealth: Payer: Self-pay | Admitting: *Deleted

## 2016-03-12 MED ORDER — ONETOUCH DELICA LANCETS 33G MISC: Status: DC

## 2016-03-12 MED ORDER — ONETOUCH DELICA LANCETS 33G MISC: Status: AC

## 2016-03-12 NOTE — Addendum Note (Signed)
Addended by: Earnstine Regal on: 03/12/2016 01:10 PM   Modules accepted: Orders

## 2016-03-12 NOTE — Telephone Encounter (Signed)
Left msg on triage stating we received escribe rx for one touch ultra lancets, but pt blood sugar monitor requires One touch delica lancets. Use ref # [id number] or send new rx electronically.Sent new rx electronically....Johny Chess

## 2016-03-15 ENCOUNTER — Telehealth: Payer: Self-pay | Admitting: Internal Medicine

## 2016-03-15 MED ORDER — OLMESARTAN-AMLODIPINE-HCTZ 20-5-12.5 MG PO TABS: ORAL_TABLET | ORAL | Status: DC

## 2016-03-15 NOTE — Telephone Encounter (Signed)
[place] [facility] tribenzor [facility] - done erx

## 2016-03-15 NOTE — Telephone Encounter (Signed)
Pt called in and said that all his meds were called in but his BP meds.  He needs [facility].  He could not tell me which on it was.  It did not see it on med list

## 2016-03-15 NOTE — Telephone Encounter (Signed)
Please advise, is patient supposed to be on another medication

## 2016-06-14 ENCOUNTER — Ambulatory Visit (INDEPENDENT_AMBULATORY_CARE_PROVIDER_SITE_OTHER): Payer: BLUE CROSS/BLUE SHIELD | Admitting: Family

## 2016-06-14 ENCOUNTER — Encounter: Payer: Self-pay | Admitting: Family

## 2016-06-14 VITALS — BP 110/76 | HR 83 | Temp 98.5°F | Ht 66.0 in | Wt 255.0 lb

## 2016-06-14 DIAGNOSIS — R030 Elevated blood-pressure reading, without diagnosis of hypertension: Secondary | ICD-10-CM

## 2016-06-14 DIAGNOSIS — IMO0001 Reserved for inherently not codable concepts without codable children: Secondary | ICD-10-CM

## 2016-06-14 NOTE — Progress Notes (Signed)
Pre visit review using our clinic review tool, if applicable. No additional management support is needed unless otherwise documented below in the visit note. 

## 2016-06-14 NOTE — Patient Instructions (Addendum)
Nice to meet you. Please come back on Monday for nurse visit to check BP.   PLEASE take blood pressure medications as prescribed.   If there is no improvement in your symptoms, or if there is any worsening of symptoms, or if you have any additional concerns, please return for re-evaluation; or, if we are closed, consider going to the Emergency Room for evaluation if symptoms urgent.  Hypertension Hypertension, commonly called high blood pressure, is when the force of blood pumping through your arteries is too strong. Your arteries are the blood vessels that carry blood from your heart throughout your body. A blood pressure reading consists of a higher number over a lower number, such as 110/72. The higher number (systolic) is the pressure inside your arteries when your heart pumps. The lower number (diastolic) is the pressure inside your arteries when your heart relaxes. Ideally you want your blood pressure below 120/80. Hypertension forces your heart to work harder to pump blood. Your arteries may become narrow or stiff. Having untreated or uncontrolled hypertension can cause heart attack, stroke, kidney disease, and other problems. RISK FACTORS Some risk factors for high blood pressure are controllable. Others are not.  Risk factors you cannot control include:   Race. You may be at higher risk if you are African American.  Age. Risk increases with age.  Gender. Men are at higher risk than women before age 3 years. After age 38, women are at higher risk than men. Risk factors you can control include:  Not getting enough exercise or physical activity.  Being overweight.  Getting too much fat, sugar, calories, or salt in your diet.  Drinking too much alcohol. SIGNS AND SYMPTOMS Hypertension does not usually cause signs or symptoms. Extremely high blood pressure (hypertensive crisis) may cause headache, anxiety, shortness of breath, and nosebleed. DIAGNOSIS To check if you have  hypertension, your health care provider will measure your blood pressure while you are seated, with your arm held at the level of your heart. It should be measured at least twice using the same arm. Certain conditions can cause a difference in blood pressure between your right and left arms. A blood pressure reading that is higher than normal on one occasion does not mean that you need treatment. If it is not clear whether you have high blood pressure, you may be asked to return on a different day to have your blood pressure checked again. Or, you may be asked to monitor your blood pressure at home for 1 or more weeks. TREATMENT Treating high blood pressure includes making lifestyle changes and possibly taking medicine. Living a healthy lifestyle can help lower high blood pressure. You may need to change some of your habits. Lifestyle changes may include:  Following the DASH diet. This diet is high in fruits, vegetables, and whole grains. It is low in salt, red meat, and added sugars.  Keep your sodium intake below 2,300 mg per day.  Getting at least 30-45 minutes of aerobic exercise at least 4 times per week.  Losing weight if necessary.  Not smoking.  Limiting alcoholic beverages.  Learning ways to reduce stress. Your health care provider may prescribe medicine if lifestyle changes are not enough to get your blood pressure under control, and if one of the following is true:  You are 73-46 years of age and your systolic blood pressure is above 140.  You are 64 years of age or older, and your systolic blood pressure is above 150.  Your  diastolic blood pressure is above 90.  You have diabetes, and your systolic blood pressure is over [id number] or your diastolic blood pressure is over 90.  You have kidney disease and your blood pressure is above 140/90.  You have heart disease and your blood pressure is above 140/90. Your personal target blood pressure may vary depending on your medical  conditions, your age, and other factors. HOME CARE INSTRUCTIONS  Have your blood pressure rechecked as directed by your health care provider.   Take medicines only as directed by your health care provider. Follow the directions carefully. Blood pressure medicines must be taken as prescribed. The medicine does not work as well when you skip doses. Skipping doses also puts you at risk for problems.  Do not smoke.   Monitor your blood pressure at home as directed by your health care provider. SEEK MEDICAL CARE IF:   You think you are having a reaction to medicines taken.  You have recurrent headaches or feel dizzy.  You have swelling in your ankles.  You have trouble with your vision. SEEK IMMEDIATE MEDICAL CARE IF:  You develop a severe headache or confusion.  You have unusual weakness, numbness, or feel faint.  You have severe chest or abdominal pain.  You vomit repeatedly.  You have trouble breathing. MAKE SURE YOU:   Understand these instructions.  Will watch your condition.  Will get help right away if you are not doing well or get worse.   This information is not intended to replace advice given to you by your health care provider. Make sure you discuss any questions you have with your health care provider.   Document Released: 11/26/2005 Document Revised: 04/12/2015 Document Reviewed: 09/18/2013 Elsevier Interactive Patient Education [employer].

## 2016-06-14 NOTE — Progress Notes (Signed)
Subjective:    Patient ID: Marcus Rivera, male    DOB: 17-Dec-1950, 65 y.o.   MRN: [id number]   Marcus Rivera is a 65 y.o. male who presents today for an acute visit.    HPI Comments: Patient here for evaluation of elevated blood pressure for the past 4 days. He's been using a wrist blood pressure cuff at home with readings as high as 160/130's. He is taking Omelsartan-Amlodipine-HCTZ  And for the past 3 days has taken an extra tablet in the evening. He has no symptoms.Denies headache, exertional chest pain or pressure, numbness or tingling radiating to left arm or jaw, palpitations, dizziness, frequent headaches, changes in vision, or shortness of breath.    BP Readings from Last 3 Encounters:  06/14/16 110/76  03/02/16 136/90  11/29/14 122/78    Past Medical History  Diagnosis Date  . DIABETES MELLITUS, TYPE II 04/12/2008  . GLUCOSE INTOLERANCE 03/12/2008  . HYPERLIPIDEMIA 04/12/2008  . ANEMIA-NOS 10/03/2010  . HYPERTENSION 03/12/2008  . PSA, INCREASED 03/18/2008  . PROSTATE CANCER, HX OF 09/05/2009  . Gout   . Cancer [facility])     prostate   Allergies: Review of patient's allergies indicates no known allergies. Current Outpatient Prescriptions on File Prior to Visit  Medication Sig Dispense Refill  . aspirin 81 MG EC tablet Take 81 mg by mouth daily.      . Blood Glucose Monitoring Suppl (ONE TOUCH ULTRA 2) w/Device KIT Use to check blood sugars twice a day Dx E11.9 1 each 0  . Calcium-Vitamin D 500-125 MG-UNIT TABS Take 1 tablet by mouth daily.     [name] glimepiride (AMARYL) 1 MG tablet Take 1 tablet (1 mg total) by mouth daily with breakfast. 90 tablet 3  . glucose blood (ONE TOUCH ULTRA TEST) test strip 1 each by Other route 2 (two) times daily. Use to check blood sugars twice a day Dx E11.9 100 each 3  . Lancets (FREESTYLE) lancets Use as directed daily 100 each 11  . metFORMIN (GLUCOPHAGE) 1000 MG tablet Take 1 tablet (1,000 mg total) by mouth 2 (two) times daily with a meal. 180  tablet 3  . Multiple Vitamin (MULTIVITAMIN) tablet Take 1 tablet by mouth daily.    . Olmesartan-Amlodipine-HCTZ 20-5-12.5 MG TABS 1 tab by mouth twice per day 180 tablet 3  . ONETOUCH DELICA LANCETS 54Y MISC Use to help check blood sugars twice a day Dx E11.9 300 each 3  . pioglitazone (ACTOS) 45 MG tablet TAKE 1 TABLET DAILY 90 tablet 3  . simvastatin (ZOCOR) 40 MG tablet Take 1 tablet (40 mg total) by mouth daily. 90 tablet 3  . colchicine 0.6 MG tablet Take 1 tablet (0.6 mg total) by mouth 2 (two) times daily as needed. (Patient not taking: Reported on 06/14/2016) 60 tablet 1   No current facility-administered medications on file prior to visit.    Social History  Substance Use Topics  . Smoking status: Former Research scientist (life sciences)  . Smokeless tobacco: Never Used  . Alcohol Use: No    Review of Systems  Constitutional: Negative for fever and chills.  Respiratory: Negative for cough and shortness of breath.   Cardiovascular: Negative for chest pain and palpitations.  Gastrointestinal: Negative for nausea and vomiting.  Neurological: Negative for dizziness and headaches.  Psychiatric/Behavioral: Negative for confusion.      Objective:    BP 110/76 mmHg  Pulse 83  Temp(Src) 98.5 F (36.9 C) (Oral)  Ht 5' 6" (1.676 m)  Wt 255 lb (115.667 kg)  BMI 41.18 kg/m2  SpO2 98%   Physical Exam  Constitutional: He appears well-developed and well-nourished.  HENT:  Right Ear: Hearing normal.  Left Ear: Hearing normal.  Mouth/Throat: Uvula is midline, oropharynx is clear and moist and mucous membranes are normal. No posterior oropharyngeal edema or posterior oropharyngeal erythema.  Eyes: Conjunctivae, EOM and lids are normal. Pupils are equal, round, and reactive to light. Lids are everted and swept, no foreign bodies found.  Normal fundus bilaterally.  Cardiovascular: Regular rhythm and normal heart sounds.   Pulmonary/Chest: Effort normal and breath sounds normal. No respiratory distress. He  has no wheezes. He has no rhonchi. He has no rales.  Lymphadenopathy:       Head (right side): No submental, no submandibular, no tonsillar, no preauricular, no posterior auricular and no occipital adenopathy present.       Head (left side): No submental, no submandibular, no tonsillar, no preauricular, no posterior auricular and no occipital adenopathy present.    He has no cervical adenopathy.  Neurological: He is alert. He has normal strength. No cranial nerve deficit or sensory deficit. He displays a negative Romberg sign.  Reflex Scores:      Bicep reflexes are 2+ on the right side and 2+ on the left side.      Patellar reflexes are 2+ on the right side and 2+ on the left side. Grip equal and strong bilateral upper extremities. Gait strong and steady. Able to perform rapid alternating movement without difficulty.  Skin: Skin is warm and dry.  Psychiatric: He has a normal mood and affect. His speech is normal and behavior is normal.  Vitals reviewed.      Assessment & Plan:   1. Elevated blood pressure Reassured by normal neurologic and cardiac exam. Patient has no symptoms to suggest hypertensive urgency or emergency at this time. Normotensive. Advised to come back on Monday for nurse check of blood pressure. Also advised him to take blood pressure medication as prescribed. Return precautions given.    I am having Mr. Botts maintain his aspirin, Calcium-Vitamin D, multivitamin, colchicine, glimepiride, freestyle, metFORMIN, pioglitazone, simvastatin, glucose blood, ONE TOUCH ULTRA 2, ONETOUCH DELICA LANCETS 00P, and Olmesartan-Amlodipine-HCTZ.   Meds ordered this encounter  Medications  . DISCONTD: simvastatin (ZOCOR) 20 MG tablet    Sig:      Start medications as prescribed and explained to patient on After Visit Summary ( AVS). Risks, benefits, and alternatives of the medications and treatment plan prescribed today were discussed, and patient expressed understanding.    Education regarding symptom management and diagnosis given to patient.   Follow-up:Plan follow-up and return precautions given if any worsening symptoms or change in condition.   Continue to follow with Cathlean Cower, MD for routine health maintenance.   Scherrie Bateman and I agreed with plan.   Mable Paris, FNP

## 2016-09-04 ENCOUNTER — Ambulatory Visit (INDEPENDENT_AMBULATORY_CARE_PROVIDER_SITE_OTHER): Payer: BLUE CROSS/BLUE SHIELD | Admitting: Internal Medicine

## 2016-09-04 ENCOUNTER — Other Ambulatory Visit (INDEPENDENT_AMBULATORY_CARE_PROVIDER_SITE_OTHER): Payer: BLUE CROSS/BLUE SHIELD

## 2016-09-04 ENCOUNTER — Encounter: Payer: Self-pay | Admitting: Internal Medicine

## 2016-09-04 VITALS — BP 140/80 | HR 82 | Temp 98.5°F | Resp 20 | Wt 263.1 lb

## 2016-09-04 DIAGNOSIS — I10 Essential (primary) hypertension: Secondary | ICD-10-CM | POA: Diagnosis not present

## 2016-09-04 DIAGNOSIS — Z8546 Personal history of malignant neoplasm of prostate: Secondary | ICD-10-CM

## 2016-09-04 DIAGNOSIS — E119 Type 2 diabetes mellitus without complications: Secondary | ICD-10-CM | POA: Diagnosis not present

## 2016-09-04 DIAGNOSIS — Z23 Encounter for immunization: Secondary | ICD-10-CM

## 2016-09-04 DIAGNOSIS — Z0001 Encounter for general adult medical examination with abnormal findings: Secondary | ICD-10-CM

## 2016-09-04 DIAGNOSIS — E785 Hyperlipidemia, unspecified: Secondary | ICD-10-CM | POA: Diagnosis not present

## 2016-09-04 DIAGNOSIS — R6889 Other general symptoms and signs: Secondary | ICD-10-CM

## 2016-09-04 LAB — HEPATIC FUNCTION PANEL
ALBUMIN: 4 g/dL (ref 3.5–5.2)
ALT: 17 U/L (ref 0–53)
AST: 16 U/L (ref 0–37)
Alkaline Phosphatase: 52 U/L (ref 39–117)
Bilirubin, Direct: 0.1 mg/dL (ref 0.0–0.3)
TOTAL PROTEIN: 6.9 g/dL (ref 6.0–8.3)
Total Bilirubin: 0.4 mg/dL (ref 0.2–1.2)

## 2016-09-04 LAB — LIPID PANEL
Cholesterol: 172 mg/dL (ref 0–200)
HDL: 74.4 mg/dL (ref >39.00)
LDL Cholesterol: 85 mg/dL (ref 0–99)
NonHDL: 97.88
TRIGLYCERIDES: 66 mg/dL (ref 0.0–149.0)
Total CHOL/HDL Ratio: 2
VLDL: 13.2 mg/dL (ref 0.0–40.0)

## 2016-09-04 LAB — BASIC METABOLIC PANEL
BUN: 18 mg/dL (ref 6–23)
CALCIUM: 9.5 mg/dL (ref 8.4–10.5)
CHLORIDE: 103 meq/L (ref 96–112)
CO2: 34 meq/L — AB (ref 19–32)
CREATININE: 1.21 mg/dL (ref 0.40–1.50)
GFR: 77.49 mL/min (ref >60.00)
Glucose, Bld: 99 mg/dL (ref 70–99)
Potassium: 5.1 mEq/L (ref 3.5–5.1)
Sodium: 143 mEq/L (ref 135–145)

## 2016-09-04 LAB — PSA: PSA: 1.26 ng/mL (ref 0.10–4.00)

## 2016-09-04 LAB — HEMOGLOBIN A1C: Hgb A1c MFr Bld: 6.6 % — ABNORMAL HIGH (ref 4.6–6.5)

## 2016-09-04 NOTE — Progress Notes (Signed)
Subjective:    Patient ID: Marcus Rivera, male    DOB: December 28, 1950, 65 y.o.   MRN: [id number]  HPI  Here to f/u; overall doing ok,  Pt denies chest pain, increasing sob or doe, wheezing, orthopnea, PND, increased LE swelling, palpitations, dizziness or syncope.  Pt denies new neurological symptoms such as new headache, or facial or extremity weakness or numbness.  Pt denies polydipsia, polyuria, or low sugar episode.   Pt denies new neurological symptoms such as new headache, or facial or extremity weakness or numbness.   Pt states overall good compliance with meds, mostly trying to follow appropriate diet, with wt overall stable,  but little exercise however.  Sugars in the 100's.  Denies urinary symptoms such as dysuria, frequency, urgency, flank pain, hematuria or n/v, fever, chills.  Had PSA slightly > 1 last visit, has not seen urology for 1 yr, asks for f/u psa. Wt has been up and donw Wt Readings from Last 3 Encounters:  09/04/16 263 lb 2 oz (119.4 kg)  06/14/16 255 lb (115.7 kg)  03/02/16 268 lb (121.6 kg)   Past Medical History:  Diagnosis Date  . ANEMIA-NOS 10/03/2010  . Cancer [facility])    prostate  . DIABETES MELLITUS, TYPE II 04/12/2008  . GLUCOSE INTOLERANCE 03/12/2008  . Gout   . HYPERLIPIDEMIA 04/12/2008  . HYPERTENSION 03/12/2008  . PROSTATE CANCER, HX OF 09/05/2009  . PSA, INCREASED 03/18/2008   Past Surgical History:  Procedure Laterality Date  . COLONOSCOPY    . COLONOSCOPY N/A 11/29/2014   Procedure: COLONOSCOPY;  Surgeon: Inda Castle, MD;  Location: [facility]COPY;  Service: Endoscopy;  Laterality: N/A;  . negative stress echo  Oct. 17, 2011   [facility]  . PROSTATE SURGERY  2009    reports that he has quit smoking. He has never used smokeless tobacco. He reports that he does not drink alcohol or use drugs. family history includes Cancer in his mother; Colon cancer in his paternal aunt; Diabetes in his other; Hypertension in his other; Liver disease in his  mother. No Known Allergies Current Outpatient Prescriptions on File Prior to Visit  Medication Sig Dispense Refill  . aspirin 81 MG EC tablet Take 81 mg by mouth daily.      . Blood Glucose Monitoring Suppl (ONE TOUCH ULTRA 2) w/Device KIT Use to check blood sugars twice a day Dx E11.9 1 each 0  . Calcium-Vitamin D 500-125 MG-UNIT TABS Take 1 tablet by mouth daily.     . colchicine 0.6 MG tablet Take 1 tablet (0.6 mg total) by mouth 2 (two) times daily as needed. 60 tablet 1  . glimepiride (AMARYL) 1 MG tablet Take 1 tablet (1 mg total) by mouth daily with breakfast. 90 tablet 3  . glucose blood (ONE TOUCH ULTRA TEST) test strip 1 each by Other route 2 (two) times daily. Use to check blood sugars twice a day Dx E11.9 100 each 3  . Lancets (FREESTYLE) lancets Use as directed daily 100 each 11  . metFORMIN (GLUCOPHAGE) 1000 MG tablet Take 1 tablet (1,000 mg total) by mouth 2 (two) times daily with a meal. 180 tablet 3  . Multiple Vitamin (MULTIVITAMIN) tablet Take 1 tablet by mouth daily.    . Olmesartan-Amlodipine-HCTZ 20-5-12.5 MG TABS 1 tab by mouth twice per day 180 tablet 3  . ONETOUCH DELICA LANCETS 34V MISC Use to help check blood sugars twice a day Dx E11.9 300 each 3  . pioglitazone (ACTOS)  45 MG tablet TAKE 1 TABLET DAILY 90 tablet 3  . simvastatin (ZOCOR) 40 MG tablet Take 1 tablet (40 mg total) by mouth daily. 90 tablet 3   No current facility-administered medications on file prior to visit.    Review of Systems  Constitutional: Negative for unusual diaphoresis or night sweats HENT: Negative for ear swelling or discharge Eyes: Negative for worsening visual haziness  Respiratory: Negative for choking and stridor.   Gastrointestinal: Negative for distension or worsening eructation Genitourinary: Negative for retention or change in urine volume.  Musculoskeletal: Negative for other MSK pain or swelling Skin: Negative for color change and worsening wound Neurological: Negative  for tremors and numbness other than noted  Psychiatric/Behavioral: Negative for decreased concentration or agitation other than above       Objective:   Physical Exam BP 140/80   Pulse 82   Temp 98.5 F (36.9 C) (Oral)   Resp 20   Wt 263 lb 2 oz (119.4 kg)   SpO2 97%   BMI 42.47 kg/m  VS noted,  Constitutional: Pt appears in no apparent distress HENT: Head: NCAT.  Right Ear: External ear normal.  Left Ear: External ear normal.  Eyes: . Pupils are equal, round, and reactive to light. Conjunctivae and EOM are normal Neck: Normal range of motion. Neck supple.  Cardiovascular: Normal rate and regular rhythm.   Pulmonary/Chest: Effort normal and breath sounds without rales or wheezing.  Abd:  Soft, NT, ND, + BS Neurological: Pt is alert. Not confused , motor grossly intact Skin: Skin is warm. No rash, no LE edema Psychiatric: Pt behavior is normal. No agitation.     Assessment & Plan:

## 2016-09-04 NOTE — Patient Instructions (Addendum)
Your Tdap (tetanus) was done today  Please continue all other medications as before, and refills have been done if requested.  Please have the pharmacy call with any other refills you may need.  Please continue your efforts at being more active, low cholesterol diet, and weight control.  You are otherwise up to date with prevention measures today.  Please keep your appointments with your specialists as you may have planned  Please go to the LAB in the Basement (turn left off the elevator) for the tests to be done today  You will be contacted by phone if any changes need to be made immediately.  Otherwise, you will receive a letter about your results with an explanation, but please check with MyChart first.  Please remember to sign up for MyChart if you have not done so, as this will be important to you in the future with finding out test results, communicating by private email, and scheduling acute appointments online when needed.  Please return in 6 months, or sooner if needed, with Lab testing done 3-5 days before

## 2016-09-04 NOTE — Progress Notes (Signed)
Pre visit review using our clinic review tool, if applicable. No additional management support is needed unless otherwise documented below in the visit note. 

## 2016-09-08 NOTE — Assessment & Plan Note (Signed)
stable overall by history and exam, recent data reviewed with pt, and pt to continue medical treatment as before,  to f/u any worsening symptoms or concerns Lab Results  Component Value Date   LDLCALC 85 09/04/2016

## 2016-09-08 NOTE — Assessment & Plan Note (Signed)
Lab Results  Component Value Date   PSA 1.26 09/04/2016   PSA 1.12 03/02/2016   PSA 31.85 (H) 03/12/2008

## 2016-09-08 NOTE — Assessment & Plan Note (Signed)
stable overall by history and exam, recent data reviewed with pt, and pt to continue medical treatment as before,  to f/u any worsening symptoms or concerns BP Readings from Last 3 Encounters:  09/04/16 140/80  06/14/16 110/76  03/02/16 136/90

## 2016-09-08 NOTE — Assessment & Plan Note (Signed)
stable overall by history and exam, recent data reviewed with pt, and pt to continue medical treatment as before,  to f/u any worsening symptoms or concerns Lab Results  Component Value Date   HGBA1C 6.6 (H) 09/04/2016

## 2016-11-22 ENCOUNTER — Other Ambulatory Visit: Payer: Self-pay | Admitting: Internal Medicine

## 2017-03-05 ENCOUNTER — Other Ambulatory Visit (INDEPENDENT_AMBULATORY_CARE_PROVIDER_SITE_OTHER): Payer: BLUE CROSS/BLUE SHIELD

## 2017-03-05 ENCOUNTER — Ambulatory Visit (INDEPENDENT_AMBULATORY_CARE_PROVIDER_SITE_OTHER): Payer: BLUE CROSS/BLUE SHIELD | Admitting: Internal Medicine

## 2017-03-05 ENCOUNTER — Encounter: Payer: Self-pay | Admitting: Internal Medicine

## 2017-03-05 VITALS — BP 126/88 | HR 68 | Temp 98.4°F | Ht 66.0 in | Wt 271.0 lb

## 2017-03-05 DIAGNOSIS — E119 Type 2 diabetes mellitus without complications: Secondary | ICD-10-CM | POA: Diagnosis not present

## 2017-03-05 DIAGNOSIS — E785 Hyperlipidemia, unspecified: Secondary | ICD-10-CM

## 2017-03-05 DIAGNOSIS — Z23 Encounter for immunization: Secondary | ICD-10-CM | POA: Diagnosis not present

## 2017-03-05 DIAGNOSIS — I10 Essential (primary) hypertension: Secondary | ICD-10-CM

## 2017-03-05 DIAGNOSIS — Z0000 Encounter for general adult medical examination without abnormal findings: Secondary | ICD-10-CM

## 2017-03-05 LAB — HEPATIC FUNCTION PANEL
ALT: 20 U/L (ref 0–53)
AST: 17 U/L (ref 0–37)
Albumin: 4.2 g/dL (ref 3.5–5.2)
Alkaline Phosphatase: 46 U/L (ref 39–117)
BILIRUBIN DIRECT: 0.1 mg/dL (ref 0.0–0.3)
BILIRUBIN TOTAL: 0.5 mg/dL (ref 0.2–1.2)
Total Protein: 6.9 g/dL (ref 6.0–8.3)

## 2017-03-05 LAB — TSH: TSH: 1.06 u[IU]/mL (ref 0.35–4.50)

## 2017-03-05 LAB — URINALYSIS, ROUTINE W REFLEX MICROSCOPIC
Bilirubin Urine: NEGATIVE
HGB URINE DIPSTICK: NEGATIVE
Ketones, ur: NEGATIVE
LEUKOCYTES UA: NEGATIVE
NITRITE: NEGATIVE
RBC / HPF: NONE SEEN (ref 0–?)
Specific Gravity, Urine: 1.015 (ref 1.000–1.030)
TOTAL PROTEIN, URINE-UPE24: NEGATIVE
Urine Glucose: NEGATIVE
Urobilinogen, UA: 0.2 (ref 0.0–1.0)
WBC, UA: NONE SEEN (ref 0–?)
pH: 6 (ref 5.0–8.0)

## 2017-03-05 LAB — CBC WITH DIFFERENTIAL/PLATELET
Basophils Absolute: 0 10*3/uL (ref 0.0–0.1)
Basophils Relative: 0.6 % (ref 0.0–3.0)
EOS PCT: 1.2 % (ref 0.0–5.0)
Eosinophils Absolute: 0 10*3/uL (ref 0.0–0.7)
HEMATOCRIT: 36.6 % — AB (ref 39.0–52.0)
Hemoglobin: 11.9 g/dL — ABNORMAL LOW (ref 13.0–17.0)
LYMPHS ABS: 0.6 10*3/uL — AB (ref 0.7–4.0)
LYMPHS PCT: 21.6 % (ref 12.0–46.0)
MCHC: 32.5 g/dL (ref 30.0–36.0)
MCV: 80 fl (ref 78.0–100.0)
MONOS PCT: 11.3 % (ref 3.0–12.0)
Monocytes Absolute: 0.3 10*3/uL (ref 0.1–1.0)
NEUTROS ABS: 1.8 10*3/uL (ref 1.4–7.7)
NEUTROS PCT: 65.3 % (ref 43.0–77.0)
Platelets: 234 10*3/uL (ref 150.0–400.0)
RBC: 4.57 Mil/uL (ref 4.22–5.81)
RDW: 14.9 % (ref 11.5–15.5)
WBC: 2.7 10*3/uL — AB (ref 4.0–10.5)

## 2017-03-05 LAB — BASIC METABOLIC PANEL
BUN: 17 mg/dL (ref 6–23)
CHLORIDE: 102 meq/L (ref 96–112)
CO2: 33 meq/L — AB (ref 19–32)
CREATININE: 1.16 mg/dL (ref 0.40–1.50)
Calcium: 9.8 mg/dL (ref 8.4–10.5)
GFR: 81.23 mL/min (ref >60.00)
GLUCOSE: 111 mg/dL — AB (ref 70–99)
POTASSIUM: 4.1 meq/L (ref 3.5–5.1)
Sodium: 140 mEq/L (ref 135–145)

## 2017-03-05 LAB — LIPID PANEL
CHOLESTEROL: 248 mg/dL — AB (ref 0–200)
HDL: 72.5 mg/dL (ref >39.00)
LDL Cholesterol: 162 mg/dL — ABNORMAL HIGH (ref 0–99)
NonHDL: 175
TRIGLYCERIDES: 67 mg/dL (ref 0.0–149.0)
Total CHOL/HDL Ratio: 3
VLDL: 13.4 mg/dL (ref 0.0–40.0)

## 2017-03-05 LAB — PSA: PSA: 1.89 ng/mL (ref 0.10–4.00)

## 2017-03-05 LAB — MICROALBUMIN / CREATININE URINE RATIO
CREATININE, U: 91.9 mg/dL
MICROALB/CREAT RATIO: 1 mg/g (ref 0.0–30.0)
Microalb, Ur: 0.9 mg/dL (ref 0.0–1.9)

## 2017-03-05 LAB — HEMOGLOBIN A1C: HEMOGLOBIN A1C: 7.2 % — AB (ref 4.6–6.5)

## 2017-03-05 MED ORDER — OLMESARTAN-AMLODIPINE-HCTZ 20-5-12.5 MG PO TABS: ORAL_TABLET | ORAL | 3 refills | Status: DC

## 2017-03-05 MED ORDER — ROSUVASTATIN CALCIUM 40 MG PO TABS: 40.0000 mg | ORAL_TABLET | Freq: Every day | ORAL | 3 refills | Status: DC

## 2017-03-05 MED ORDER — PIOGLITAZONE HCL 45 MG PO TABS: ORAL_TABLET | ORAL | 3 refills | Status: DC

## 2017-03-05 MED ORDER — SIMVASTATIN 40 MG PO TABS: 40.0000 mg | ORAL_TABLET | Freq: Every day | ORAL | 3 refills | Status: DC

## 2017-03-05 MED ORDER — METFORMIN HCL 1000 MG PO TABS: 1000.0000 mg | ORAL_TABLET | Freq: Two times a day (BID) | ORAL | 3 refills | Status: DC

## 2017-03-05 NOTE — Progress Notes (Signed)
Pre visit review using our clinic review tool, if applicable. No additional management support is needed unless otherwise documented below in the visit note. 

## 2017-03-05 NOTE — Progress Notes (Signed)
Subjective:    Patient ID: Marcus Rivera, male    DOB: 07-16-1951, 66 y.o.   MRN: [id number]  HPI  Here for wellness and f/u;  Overall doing ok;  Pt denies Chest pain, worsening SOB, DOE, wheezing, orthopnea, PND, worsening LE edema, palpitations, dizziness or syncope.  Pt denies neurological change such as new headache, facial or extremity weakness.  Pt denies polydipsia, polyuria, or low sugar symptoms. Pt states overall good compliance with treatment and medications, good tolerability, and has been trying to follow appropriate diet.  Pt denies worsening depressive symptoms, suicidal ideation or panic. No fever, night sweats, wt loss, loss of appetite, or other constitutional symptoms.  Pt states good ability with ADL's, has low fall risk, home safety reviewed and adequate, no other significant changes in hearing or vision, and not active with exercise. Still working, no dfeinite plans to retire. May have miuld bilat achilles heel soreness but no opern sores or wounds.  Past Medical History:  Diagnosis Date  . ANEMIA-NOS 10/03/2010  . Cancer [facility])    prostate  . DIABETES MELLITUS, TYPE II 04/12/2008  . GLUCOSE INTOLERANCE 03/12/2008  . Gout   . HYPERLIPIDEMIA 04/12/2008  . HYPERTENSION 03/12/2008  . PROSTATE CANCER, HX OF 09/05/2009  . PSA, INCREASED 03/18/2008   Past Surgical History:  Procedure Laterality Date  . COLONOSCOPY    . COLONOSCOPY N/A 11/29/2014   Procedure: COLONOSCOPY;  Surgeon: Inda Castle, MD;  Location: [facility];  Service: Endoscopy;  Laterality: N/A;  . negative stress echo  Oct. 17, 2011   [facility]  . PROSTATE SURGERY  2009    reports that he has quit smoking. He has never used smokeless tobacco. He reports that he does not drink alcohol or use drugs. family history includes Cancer in his mother; Colon cancer in his paternal aunt; Diabetes in his other; Hypertension in his other; Liver disease in his mother. No Known Allergies Current Outpatient  Prescriptions on File Prior to Visit  Medication Sig Dispense Refill  . aspirin 81 MG EC tablet Take 81 mg by mouth daily.      . Blood Glucose Monitoring Suppl (ONE TOUCH ULTRA 2) w/Device KIT Use to check blood sugars twice a day Dx E11.9 1 each 0  . Calcium-Vitamin D 500-125 MG-UNIT TABS Take 1 tablet by mouth daily.     . colchicine 0.6 MG tablet Take 1 tablet (0.6 mg total) by mouth 2 (two) times daily as needed. 60 tablet 1  . glimepiride (AMARYL) 1 MG tablet Take 1 tablet (1 mg total) by mouth daily with breakfast. 90 tablet 3  . glucose blood (ONE TOUCH ULTRA TEST) test strip 1 each by Other route 2 (two) times daily. Use to check blood sugars twice a day Dx E11.9 100 each 3  . Lancets (FREESTYLE) lancets Use as directed daily 100 each 11  . Multiple Vitamin (MULTIVITAMIN) tablet Take 1 tablet by mouth daily.    Glory Rosebush DELICA LANCETS 75Q MISC Use to help check blood sugars twice a day Dx E11.9 300 each 3   No current facility-administered medications on file prior to visit.    Review of Systems Constitutional: Negative for other unusual diaphoresis, sweats, appetite or weight changes HENT: Negative for other worsening hearing loss, ear pain, facial swelling, mouth sores or neck stiffness.   Eyes: Negative for other worsening pain, redness or other visual disturbance.  Respiratory: Negative for other stridor or swelling Cardiovascular: Negative for other palpitations or  other chest pain  Gastrointestinal: Negative for worsening diarrhea or loose stools, blood in stool, distention or other pain Genitourinary: Negative for hematuria, flank pain or other change in urine volume.  Musculoskeletal: Negative for myalgias or other joint swelling.  Skin: Negative for other color change, or other wound or worsening drainage.  Neurological: Negative for other syncope or numbness. Hematological: Negative for other adenopathy or swelling Psychiatric/Behavioral: Negative for hallucinations,  other worsening agitation, SI, self-injury, or new decreased concentration All other system neg per pt    Objective:   Physical Exam BP 126/88   Pulse 68   Temp 98.4 F (36.9 C)   Ht _0  (1.676 m)   Wt 271 lb (122.9 kg)   SpO2 97%   BMI 43.74 kg/m  VS noted,  Constitutional: Pt is oriented to person, place, and time. Appears well-developed and well-nourished, in no significant distress and comfortable Head: Normocephalic and atraumatic  Eyes: Conjunctivae and EOM are normal. Pupils are equal, round, and reactive to light Right Ear: External ear normal without discharge Left Ear: External ear normal without discharge Nose: Nose without discharge or deformity Mouth/Throat: Oropharynx is without other ulcerations and moist  Neck: Normal range of motion. Neck supple. No JVD present. No tracheal deviation present or significant neck LA or mass Cardiovascular: Normal rate, regular rhythm, normal heart sounds and intact distal pulses.   Pulmonary/Chest: WOB normal and breath sounds without rales or wheezing  Abdominal: Soft. Bowel sounds are normal. NT. No HSM  Musculoskeletal: Normal range of motion. Exhibits no edema Lymphadenopathy: Has no other cervical adenopathy.  Neurological: Pt is alert and oriented to person, place, and time. Pt has normal reflexes. No cranial nerve deficit. Motor grossly intact, Gait intact Skin: Skin is warm and dry. No rash noted or new ulcerations Psychiatric:  Has normal mood and affect. Behavior is normal without agitation No other exam findings    Assessment & Plan:

## 2017-03-05 NOTE — Patient Instructions (Addendum)
You had the Prevnar pneumonia shot today  You will be contacted regarding the referral for: eye doctor  OK to stop the generic zocor, and start the crestor (generic)  Please continue all other medications as before, and refills have been done if requested.  Please have the pharmacy call with any other refills you may need.  Please continue your efforts at being more active, low cholesterol diet, and weight control.  You are otherwise up to date with prevention measures today.  Please keep your appointments with your specialists as you may have planned  Please go to the LAB in the Basement (turn left off the elevator) for the tests to be done today  You will be contacted by phone if any changes need to be made immediately.  Otherwise, you will receive a letter about your results with an explanation, but please check with MyChart first.  Please remember to sign up for MyChart if you have not done so, as this will be important to you in the future with finding out test results, communicating by private email, and scheduling acute appointments online when needed.  Please return in 6 months, or sooner if needed, with Lab testing done 3-5 days before

## 2017-03-06 ENCOUNTER — Telehealth: Payer: Self-pay | Admitting: *Deleted

## 2017-03-06 LAB — HIV ANTIBODY (ROUTINE TESTING W REFLEX): HIV: NONREACTIVE

## 2017-03-06 MED ORDER — OLMESARTAN-AMLODIPINE-HCTZ 40-10-25 MG PO TABS: 1.0000 | ORAL_TABLET | ORAL | 3 refills | Status: DC

## 2017-03-06 MED ORDER — ROSUVASTATIN CALCIUM 40 MG PO TABS: 40.0000 mg | ORAL_TABLET | Freq: Every day | ORAL | 3 refills | Status: DC

## 2017-03-06 NOTE — Telephone Encounter (Signed)
Rec'd call pt states insurance plan will not cover the Olmesartan-amlodipine-HCTZ to take twice a day. Per pharmacist need to be change to a single higher dose 40-10-25 mg once a day., and MD change cholesterol medication but pharmacy state they did not received. Inform pt per chart MD sent Rosuvastatin will resend to [facility]

## 2017-03-09 NOTE — Assessment & Plan Note (Signed)
stable overall by history and exam, recent data reviewed with pt, and pt to continue medical treatment as before,  to f/u any worsening symptoms or concerns Lab Results  Component Value Date   HGBA1C 7.2 (H) 03/05/2017

## 2017-03-09 NOTE — Assessment & Plan Note (Signed)
stable overall by history and exam, recent data reviewed with pt, and pt to continue medical treatment as before,  to f/u any worsening symptoms or concerns BP Readings from Last 3 Encounters:  03/05/17 126/88  09/04/16 140/80  06/14/16 110/76

## 2017-03-09 NOTE — Assessment & Plan Note (Signed)

## 2017-03-09 NOTE — Assessment & Plan Note (Signed)
Lab Results  Component Value Date   LDLCALC 162 (H) 03/05/2017   Goal < 70, for change zocor to crestor 40, cont diet

## 2017-09-06 ENCOUNTER — Other Ambulatory Visit (INDEPENDENT_AMBULATORY_CARE_PROVIDER_SITE_OTHER): Payer: BLUE CROSS/BLUE SHIELD

## 2017-09-06 ENCOUNTER — Encounter: Payer: Self-pay | Admitting: Internal Medicine

## 2017-09-06 ENCOUNTER — Ambulatory Visit (INDEPENDENT_AMBULATORY_CARE_PROVIDER_SITE_OTHER): Payer: BLUE CROSS/BLUE SHIELD | Admitting: Internal Medicine

## 2017-09-06 VITALS — BP 122/78 | HR 84 | Temp 98.2°F | Ht 66.0 in | Wt 256.0 lb

## 2017-09-06 DIAGNOSIS — R972 Elevated prostate specific antigen [PSA]: Secondary | ICD-10-CM | POA: Diagnosis not present

## 2017-09-06 DIAGNOSIS — I10 Essential (primary) hypertension: Secondary | ICD-10-CM | POA: Diagnosis not present

## 2017-09-06 DIAGNOSIS — E119 Type 2 diabetes mellitus without complications: Secondary | ICD-10-CM

## 2017-09-06 DIAGNOSIS — Z0000 Encounter for general adult medical examination without abnormal findings: Secondary | ICD-10-CM

## 2017-09-06 DIAGNOSIS — Z23 Encounter for immunization: Secondary | ICD-10-CM | POA: Diagnosis not present

## 2017-09-06 DIAGNOSIS — E785 Hyperlipidemia, unspecified: Secondary | ICD-10-CM | POA: Diagnosis not present

## 2017-09-06 LAB — LIPID PANEL
CHOL/HDL RATIO: 2
Cholesterol: 134 mg/dL (ref 0–200)
HDL: 71.5 mg/dL (ref >39.00)
LDL CALC: 54 mg/dL (ref 0–99)
NONHDL: 62.58
TRIGLYCERIDES: 44 mg/dL (ref 0.0–149.0)
VLDL: 8.8 mg/dL (ref 0.0–40.0)

## 2017-09-06 LAB — BASIC METABOLIC PANEL
BUN: 18 mg/dL (ref 6–23)
CALCIUM: 10.5 mg/dL (ref 8.4–10.5)
CO2: 31 mEq/L (ref 19–32)
CREATININE: 1.16 mg/dL (ref 0.40–1.50)
Chloride: 100 mEq/L (ref 96–112)
GFR: 81.1 mL/min (ref >60.00)
Glucose, Bld: 119 mg/dL — ABNORMAL HIGH (ref 70–99)
Potassium: 5 mEq/L (ref 3.5–5.1)
Sodium: 139 mEq/L (ref 135–145)

## 2017-09-06 LAB — HEPATIC FUNCTION PANEL
ALT: 17 U/L (ref 0–53)
AST: 18 U/L (ref 0–37)
Albumin: 4.4 g/dL (ref 3.5–5.2)
Alkaline Phosphatase: 48 U/L (ref 39–117)
BILIRUBIN DIRECT: 0.1 mg/dL (ref 0.0–0.3)
BILIRUBIN TOTAL: 0.4 mg/dL (ref 0.2–1.2)
Total Protein: 7.1 g/dL (ref 6.0–8.3)

## 2017-09-06 LAB — HEMOGLOBIN A1C: Hgb A1c MFr Bld: 6.7 % — ABNORMAL HIGH (ref 4.6–6.5)

## 2017-09-06 LAB — PSA: PSA: 2.2 ng/mL (ref 0.10–4.00)

## 2017-09-06 MED ORDER — COLCHICINE 0.6 MG PO TABS: 0.6000 mg | ORAL_TABLET | Freq: Two times a day (BID) | ORAL | 1 refills | Status: DC | PRN

## 2017-09-06 NOTE — Assessment & Plan Note (Signed)
S/p prostatectomy for prostate ca, lost to f/u recently with urology but now with somewhat suspicious possible trending up PSA; will recheck today, refer back to Dr Risa Grill

## 2017-09-06 NOTE — Patient Instructions (Addendum)
Please continue all other medications as before, and refills have been done if requested.  Please have the pharmacy call with any other refills you may need.  Please continue your efforts at being more active, low cholesterol diet, and weight control.  Please keep your appointments with your specialists as you may have planned  You will be contacted regarding the referral for: Urology  Please go to the LAB in the Basement (turn left off the elevator) for the tests to be done today  You will be contacted by phone if any changes need to be made immediately.  Otherwise, you will receive a letter about your results with an explanation, but please check with MyChart first.  Please remember to sign up for MyChart if you have not done so, as this will be important to you in the future with finding out test results, communicating by private email, and scheduling acute appointments online when needed.  Please return in 6 months, or sooner if needed, with Lab testing done 3-5 days before

## 2017-09-06 NOTE — Assessment & Plan Note (Signed)
Lost 15 lbs with better diet and exercise, now walking 4-5 times per wk up to 1 hour; to cont diet, exercise, current tx and f/u labs today

## 2017-09-06 NOTE — Progress Notes (Signed)
Subjective:    Patient ID: Marcus Rivera, male    DOB: 09-Aug-1951, 66 y.o.   MRN: [id number]  HPI  Here to f/u; overall doing ok,  Pt denies chest pain, increasing sob or doe, wheezing, orthopnea, PND, increased LE swelling, palpitations, dizziness or syncope.  Pt denies new neurological symptoms such as new headache, or facial or extremity weakness or numbness.  Pt denies polydipsia, polyuria, or low sugar episode.  Pt states overall good compliance with meds, mostly trying to follow appropriate diet, with wt overall stable,  but little exercise however. Denies urinary symptoms such as dysuria, frequency, urgency, flank pain, hematuria or n/v, fever, chills.  Has been lost to f/u with urology Dr Risa Grill for about 1.5 yrs, but willing to return Wt Readings from Last 3 Encounters:  09/06/17 256 lb (116.1 kg)  03/05/17 271 lb (122.9 kg)  09/04/16 263 lb 2 oz (119.4 kg)   Past Medical History:  Diagnosis Date  . ANEMIA-NOS 10/03/2010  . Cancer [facility])    prostate  . DIABETES MELLITUS, TYPE II 04/12/2008  . GLUCOSE INTOLERANCE 03/12/2008  . Gout   . HYPERLIPIDEMIA 04/12/2008  . HYPERTENSION 03/12/2008  . PROSTATE CANCER, HX OF 09/05/2009  . PSA, INCREASED 03/18/2008   Past Surgical History:  Procedure Laterality Date  . COLONOSCOPY    . COLONOSCOPY N/A 11/29/2014   Procedure: COLONOSCOPY;  Surgeon: Inda Castle, MD;  Location: [facility];  Service: Endoscopy;  Laterality: N/A;  . negative stress echo  Oct. 17, 2011   [facility]  . PROSTATE SURGERY  2009    reports that he has quit smoking. He has never used smokeless tobacco. He reports that he does not drink alcohol or use drugs. family history includes Cancer in his mother; Colon cancer in his paternal aunt; Diabetes in his other; Hypertension in his other; Liver disease in his mother. No Known Allergies Current Outpatient Prescriptions on File Prior to Visit  Medication Sig Dispense Refill  . aspirin 81 MG EC tablet Take  81 mg by mouth daily.      . Blood Glucose Monitoring Suppl (ONE TOUCH ULTRA 2) w/Device KIT Use to check blood sugars twice a day Dx E11.9 1 each 0  . Calcium-Vitamin D 500-125 MG-UNIT TABS Take 1 tablet by mouth daily.     [name] glimepiride (AMARYL) 1 MG tablet Take 1 tablet (1 mg total) by mouth daily with breakfast. 90 tablet 3  . glucose blood (ONE TOUCH ULTRA TEST) test strip 1 each by Other route 2 (two) times daily. Use to check blood sugars twice a day Dx E11.9 100 each 3  . Lancets (FREESTYLE) lancets Use as directed daily 100 each 11  . metFORMIN (GLUCOPHAGE) 1000 MG tablet Take 1 tablet (1,000 mg total) by mouth 2 (two) times daily with a meal. 180 tablet 3  . Multiple Vitamin (MULTIVITAMIN) tablet Take 1 tablet by mouth daily.    . Olmesartan-Amlodipine-HCTZ 40-10-25 MG TABS Take 1 tablet by mouth every morning. 90 tablet 3  . ONETOUCH DELICA LANCETS 96P MISC Use to help check blood sugars twice a day Dx E11.9 300 each 3  . pioglitazone (ACTOS) 45 MG tablet TAKE 1 TABLET DAILY (APPOINTMENT IS REQUIRED FOR ANY FURTHER REFILLS) 90 tablet 3  . rosuvastatin (CRESTOR) 40 MG tablet Take 1 tablet (40 mg total) by mouth daily. 90 tablet 3   No current facility-administered medications on file prior to visit.    Review of Systems  Constitutional:  Negative for other unusual diaphoresis or sweats HENT: Negative for ear discharge or swelling Eyes: Negative for other worsening visual disturbances Respiratory: Negative for stridor or other swelling  Gastrointestinal: Negative for worsening distension or other blood Genitourinary: Negative for retention or other urinary change Musculoskeletal: Negative for other MSK pain or swelling Skin: Negative for color change or other new lesions Neurological: Negative for worsening tremors and other numbness  Psychiatric/Behavioral: Negative for worsening agitation or other fatigue All other system neg per pt    Objective:   Physical Exam BP 122/78    Pulse 84   Temp 98.2 F (36.8 C) (Oral)   Ht _0  (1.676 m)   Wt 256 lb (116.1 kg)   SpO2 98%   BMI 41.32 kg/m   VS noted,  Constitutional: Pt appears in NAD HENT: Head: NCAT.  Right Ear: External ear normal.  Left Ear: External ear normal.  Eyes: . Pupils are equal, round, and reactive to light. Conjunctivae and EOM are normal Nose: without d/c or deformity Neck: Neck supple. Gross normal ROM Cardiovascular: Normal rate and regular rhythm.   Pulmonary/Chest: Effort normal and breath sounds without rales or wheezing.  Neurological: Pt is alert. At baseline orientation, motor grossly intact Skin: Skin is warm. No rashes, other new lesions, no LE edema Psychiatric: Pt behavior is normal without agitation  No other exam findings Lab Results  Component Value Date   WBC 2.7 (L) 03/05/2017   HGB 11.9 (L) 03/05/2017   HCT 36.6 (L) 03/05/2017   PLT 234.0 03/05/2017   GLUCOSE 111 (H) 03/05/2017   CHOL 248 (H) 03/05/2017   TRIG 67.0 03/05/2017   HDL 72.50 03/05/2017   LDLDIRECT 139.7 09/05/2009   LDLCALC 162 (H) 03/05/2017   ALT 20 03/05/2017   AST 17 03/05/2017   NA 140 03/05/2017   K 4.1 03/05/2017   CL 102 03/05/2017   CREATININE 1.16 03/05/2017   BUN 17 03/05/2017   CO2 33 (H) 03/05/2017   TSH 1.06 03/05/2017   PSA 1.89 03/05/2017   HGBA1C 7.2 (H) 03/05/2017   MICROALBUR 0.9 03/05/2017   Lab Results  Component Value Date   PSA 1.89 03/05/2017   PSA 1.26 09/04/2016   PSA 1.12 03/02/2016       Assessment & Plan:

## 2017-09-06 NOTE — Assessment & Plan Note (Signed)
stable overall by history and exam, recent data reviewed with pt, and pt to continue medical treatment as before,  to f/u any worsening symptoms or concerns BP Readings from Last 3 Encounters:  09/06/17 122/78  03/05/17 126/88  09/04/16 140/80

## 2017-09-06 NOTE — Assessment & Plan Note (Signed)
stable overall by history and exam, recent data reviewed with pt, and pt to continue medical treatment as before,  to f/u any worsening symptoms or concerns Lab Results  Component Value Date   LDLCALC 162 (H) 03/05/2017  pt states was taking his statin, but I suspect he missed a few inadvertently it seems, for cont same dose, recheck labs today

## 2018-03-06 ENCOUNTER — Other Ambulatory Visit (INDEPENDENT_AMBULATORY_CARE_PROVIDER_SITE_OTHER): Payer: BLUE CROSS/BLUE SHIELD

## 2018-03-06 ENCOUNTER — Encounter: Payer: Self-pay | Admitting: Internal Medicine

## 2018-03-06 ENCOUNTER — Ambulatory Visit (INDEPENDENT_AMBULATORY_CARE_PROVIDER_SITE_OTHER): Payer: BLUE CROSS/BLUE SHIELD | Admitting: Internal Medicine

## 2018-03-06 VITALS — BP 126/82 | HR 65 | Temp 98.7°F | Ht 66.0 in | Wt 264.0 lb

## 2018-03-06 DIAGNOSIS — E785 Hyperlipidemia, unspecified: Secondary | ICD-10-CM | POA: Diagnosis not present

## 2018-03-06 DIAGNOSIS — E119 Type 2 diabetes mellitus without complications: Secondary | ICD-10-CM | POA: Diagnosis not present

## 2018-03-06 DIAGNOSIS — I10 Essential (primary) hypertension: Secondary | ICD-10-CM | POA: Diagnosis not present

## 2018-03-06 DIAGNOSIS — Z0000 Encounter for general adult medical examination without abnormal findings: Secondary | ICD-10-CM | POA: Diagnosis not present

## 2018-03-06 LAB — BASIC METABOLIC PANEL
BUN: 19 mg/dL (ref 6–23)
CALCIUM: 10.3 mg/dL (ref 8.4–10.5)
CO2: 31 meq/L (ref 19–32)
Chloride: 103 mEq/L (ref 96–112)
Creatinine, Ser: 1.19 mg/dL (ref 0.40–1.50)
GFR: 78.63 mL/min (ref >60.00)
GLUCOSE: 127 mg/dL — AB (ref 70–99)
Potassium: 5.2 mEq/L — ABNORMAL HIGH (ref 3.5–5.1)
SODIUM: 143 meq/L (ref 135–145)

## 2018-03-06 LAB — LIPID PANEL
CHOL/HDL RATIO: 2
Cholesterol: 133 mg/dL (ref 0–200)
HDL: 88.3 mg/dL (ref >39.00)
LDL Cholesterol: 34 mg/dL (ref 0–99)
NONHDL: 44.67
Triglycerides: 52 mg/dL (ref 0.0–149.0)
VLDL: 10.4 mg/dL (ref 0.0–40.0)

## 2018-03-06 LAB — CBC WITH DIFFERENTIAL/PLATELET
BASOS ABS: 0 10*3/uL (ref 0.0–0.1)
Basophils Relative: 0.5 % (ref 0.0–3.0)
EOS ABS: 0 10*3/uL (ref 0.0–0.7)
EOS PCT: 1.6 % (ref 0.0–5.0)
HCT: 34.1 % — ABNORMAL LOW (ref 39.0–52.0)
Hemoglobin: 11.2 g/dL — ABNORMAL LOW (ref 13.0–17.0)
LYMPHS ABS: 0.7 10*3/uL (ref 0.7–4.0)
Lymphocytes Relative: 23.8 % (ref 12.0–46.0)
MCHC: 32.9 g/dL (ref 30.0–36.0)
MCV: 79.8 fl (ref 78.0–100.0)
MONO ABS: 0.4 10*3/uL (ref 0.1–1.0)
Monocytes Relative: 12.4 % — ABNORMAL HIGH (ref 3.0–12.0)
NEUTROS PCT: 61.7 % (ref 43.0–77.0)
Neutro Abs: 1.8 10*3/uL (ref 1.4–7.7)
Platelets: 233 10*3/uL (ref 150.0–400.0)
RBC: 4.27 Mil/uL (ref 4.22–5.81)
RDW: 15.2 % (ref 11.5–15.5)
WBC: 2.9 10*3/uL — ABNORMAL LOW (ref 4.0–10.5)

## 2018-03-06 LAB — URINALYSIS, ROUTINE W REFLEX MICROSCOPIC
Bilirubin Urine: NEGATIVE
Hgb urine dipstick: NEGATIVE
Ketones, ur: NEGATIVE
Leukocytes, UA: NEGATIVE
Nitrite: NEGATIVE
PH: 7 (ref 5.0–8.0)
RBC / HPF: NONE SEEN (ref 0–?)
SPECIFIC GRAVITY, URINE: 1.015 (ref 1.000–1.030)
Total Protein, Urine: NEGATIVE
UROBILINOGEN UA: 0.2 (ref 0.0–1.0)
Urine Glucose: NEGATIVE

## 2018-03-06 LAB — HEPATIC FUNCTION PANEL
ALT: 27 U/L (ref 0–53)
AST: 23 U/L (ref 0–37)
Albumin: 4.3 g/dL (ref 3.5–5.2)
Alkaline Phosphatase: 46 U/L (ref 39–117)
BILIRUBIN DIRECT: 0.1 mg/dL (ref 0.0–0.3)
BILIRUBIN TOTAL: 0.4 mg/dL (ref 0.2–1.2)
Total Protein: 7.4 g/dL (ref 6.0–8.3)

## 2018-03-06 LAB — MICROALBUMIN / CREATININE URINE RATIO
CREATININE, U: 66.1 mg/dL
Microalb Creat Ratio: 1.1 mg/g (ref 0.0–30.0)
Microalb, Ur: 0.7 mg/dL (ref 0.0–1.9)

## 2018-03-06 LAB — TSH: TSH: 1.46 u[IU]/mL (ref 0.35–4.50)

## 2018-03-06 LAB — HEMOGLOBIN A1C: Hgb A1c MFr Bld: 7.2 % — ABNORMAL HIGH (ref 4.6–6.5)

## 2018-03-06 MED ORDER — ZOSTER VAC RECOMB ADJUVANTED 50 MCG/0.5ML IM SUSR: 0.5000 mL | Freq: Once | INTRAMUSCULAR | 1 refills | Status: AC

## 2018-03-06 NOTE — Assessment & Plan Note (Signed)
stable overall by history and exam, recent data reviewed with pt, and pt to continue medical treatment as before,  to f/u any worsening symptoms or concerns Lab Results  Component Value Date   LDLCALC 54 09/06/2017

## 2018-03-06 NOTE — Progress Notes (Signed)
Subjective:    Patient ID: Marcus Rivera, male    DOB: 03-Apr-1951, 67 y.o.   MRN:   HPI:  Here for wellness and f/u;  Overall doing ok;  Pt denies Chest pain, worsening SOB, DOE, wheezing, orthopnea, PND, worsening LE edema, palpitations, dizziness or syncope.  Pt denies neurological change such as new headache, facial or extremity weakness.  Pt denies polydipsia, polyuria, or low sugar symptoms. Pt states overall good compliance with treatment and medications, good tolerability, and has been trying to follow appropriate diet.  Pt denies worsening depressive symptoms, suicidal ideation or panic. No fever, night sweats, wt loss, loss of appetite, or other constitutional symptoms.  Pt states good ability with ADL's, has low fall risk, home safety reviewed and adequate, no other significant changes in hearing or vision, and only occasionally active with exercise.. Plans to do more but not yet started. Back on leupron now since nov 2018 and appetite increased, wt increased.  Last PSA last month 0.5 per pt, and for f/u in May 2019 leupron repeat.  Has a standing desk at work now, and even does a few wt lifts at working.  Has d/w walking partner and to start walking soon.  Decline pneumonia shot today but will do the shingrix.   Wt Readings from Last 3 Encounters:  03/06/18 264 lb (119.7 kg)  09/06/17 256 lb (116.1 kg)  03/05/17 271 lb (122.9 kg)   Past Medical History:  Diagnosis Date  . ANEMIA-NOS 10/03/2010  . Cancer [facility])    prostate  . DIABETES MELLITUS, TYPE II 04/12/2008  . GLUCOSE INTOLERANCE 03/12/2008  . Gout   . HYPERLIPIDEMIA 04/12/2008  . HYPERTENSION 03/12/2008  . PROSTATE CANCER, HX OF 09/05/2009  . PSA, INCREASED 03/18/2008   Past Surgical History:  Procedure Laterality Date  . COLONOSCOPY    . COLONOSCOPY N/A 11/29/2014   Procedure: COLONOSCOPY;  Surgeon: Inda Castle, MD;  Location: [facility];  Service: Endoscopy;  Laterality: N/A;  . negative stress echo  Oct. 17, 2011   [facility]  . PROSTATE SURGERY  2009    reports that he has quit smoking. He has never used smokeless tobacco. He reports that he does not drink alcohol or use drugs. family history includes Cancer in his mother; Colon cancer in his paternal aunt; Diabetes in his other; Hypertension in his other; Liver disease in his mother. No Known Allergies Current Outpatient Medications on File Prior to Visit  Medication Sig Dispense Refill  . aspirin 81 MG EC tablet Take 81 mg by mouth daily.      . Blood Glucose Monitoring Suppl (ONE TOUCH ULTRA 2) w/Device KIT Use to check blood sugars twice a day Dx E11.9 1 each 0  . Calcium-Vitamin D 500-125 MG-UNIT TABS Take 1 tablet by mouth daily.     . colchicine 0.6 MG tablet Take 1 tablet (0.6 mg total) by mouth 2 (two) times daily as needed. 60 tablet 1  . glimepiride (AMARYL) 1 MG tablet Take 1 tablet (1 mg total) by mouth daily with breakfast. 90 tablet 3  . glucose blood (ONE TOUCH ULTRA TEST) test strip 1 each by Other route 2 (two) times daily. Use to check blood sugars twice a day Dx E11.9 100 each 3  . Lancets (FREESTYLE) lancets Use as directed daily 100 each 11  . metFORMIN (GLUCOPHAGE) 1000 MG tablet Take 1 tablet (1,000 mg total) by mouth 2 (two) times daily with a meal. 180 tablet 3  .  Multiple Vitamin (MULTIVITAMIN) tablet Take 1 tablet by mouth daily.    . Olmesartan-Amlodipine-HCTZ 40-10-25 MG TABS Take 1 tablet by mouth every morning. 90 tablet 3  . ONETOUCH DELICA LANCETS 16X MISC Use to help check blood sugars twice a day Dx E11.9 300 each 3  . pioglitazone (ACTOS) 45 MG tablet TAKE 1 TABLET DAILY (APPOINTMENT IS REQUIRED FOR ANY FURTHER REFILLS) 90 tablet 3  . rosuvastatin (CRESTOR) 40 MG tablet Take 1 tablet (40 mg total) by mouth daily. 90 tablet 3   No current facility-administered medications on file prior to visit.    Review of Systems  Constitutional: Negative for other unusual diaphoresis, sweats, appetite or  weight changes HENT: Negative for other worsening hearing loss, ear pain, facial swelling, mouth sores or neck stiffness.   Eyes: Negative for other worsening pain, redness or other visual disturbance.  Respiratory: Negative for other stridor or swelling Cardiovascular: Negative for other palpitations or other chest pain  Gastrointestinal: Negative for worsening diarrhea or loose stools, blood in stool, distention or other pain Genitourinary: Negative for hematuria, flank pain or other change in urine volume.  Musculoskeletal: Negative for myalgias or other joint swelling.  Skin: Negative for other color change, or other wound or worsening drainage.  Neurological: Negative for other syncope or numbness. Hematological: Negative for other adenopathy or swelling Psychiatric/Behavioral: Negative for hallucinations, other worsening agitation, SI, self-injury, or new decreased concentration All other system neg per pt    Objective:   Physical Exam BP 126/82   Pulse 65   Temp 98.7 F (37.1 C) (Oral)   Ht 5' 6"  (1.676 m)   Wt 264 lb (119.7 kg)   SpO2 99%   BMI 42.61 kg/m  VS noted,  Constitutional: Pt is oriented to person, place, and time. Appears well-developed and well-nourished, in no significant distress and comfortable Head: Normocephalic and atraumatic  Eyes: Conjunctivae and EOM are normal. Pupils are equal, round, and reactive to light Right Ear: External ear normal without discharge Left Ear: External ear normal without discharge Nose: Nose without discharge or deformity Mouth/Throat: Oropharynx is without other ulcerations and moist  Neck: Normal range of motion. Neck supple. No JVD present. No tracheal deviation present or significant neck LA or mass Cardiovascular: Normal rate, regular rhythm, normal heart sounds and intact distal pulses.  Pulmonary/Chest: WOB normal and breath sounds without rales or wheezing  Abdominal: Soft. Bowel sounds are normal. NT. No HSM    Musculoskeletal: Normal range of motion. Exhibits no edema Lymphadenopathy: Has no other cervical adenopathy.  Neurological: Pt is alert and oriented to person, place, and time. Pt has normal reflexes. No cranial nerve deficit. Motor grossly intact, Gait intact Skin: Skin is warm and dry. No rash noted or new ulcerations Psychiatric:  Has normal mood and affect. Behavior is normal without agitationNo other exam findings No other exam findings  Lab Results  Component Value Date   WBC 2.7 (L) 03/05/2017   HGB 11.9 (L) 03/05/2017   HCT 36.6 (L) 03/05/2017   PLT 234.0 03/05/2017   GLUCOSE 119 (H) 09/06/2017   CHOL 134 09/06/2017   TRIG 44.0 09/06/2017   HDL 71.50 09/06/2017   LDLDIRECT 139.7 09/05/2009   LDLCALC 54 09/06/2017   ALT 17 09/06/2017   AST 18 09/06/2017   NA 139 09/06/2017   K 5.0 09/06/2017   CL 100 09/06/2017   CREATININE 1.16 09/06/2017   BUN 18 09/06/2017   CO2 31 09/06/2017   TSH 1.06 03/05/2017   PSA  2.20 09/06/2017   HGBA1C 6.7 (H) 09/06/2017   MICROALBUR 0.9 03/05/2017       Assessment & Plan:

## 2018-03-06 NOTE — Assessment & Plan Note (Signed)

## 2018-03-06 NOTE — Assessment & Plan Note (Signed)
BP Readings from Last 3 Encounters:  03/06/18 126/82  09/06/17 122/78  03/05/17 126/88  stable overall by history and exam, recent data reviewed with pt, and pt to continue medical treatment as before,  to f/u any worsening symptoms or concerns

## 2018-03-06 NOTE — Patient Instructions (Signed)
Please continue all other medications as before, and refills have been done if requested.  Please have the pharmacy call with any other refills you may need.  Please continue your efforts at being more active, low cholesterol diet, and weight control.  You are otherwise up to date with prevention measures today.  Please keep your appointments with your specialists as you may have planned  .[email or web address]  You will be contacted by phone if any changes need to be made immediately.  Otherwise, you will receive a letter about your results with an explanation, but please check with MyChart first.  Please remember to sign up for MyChart if you have not done so, as this will be important to you in the future with finding out test results, communicating by private email, and scheduling acute appointments online when needed.  Please return in 6 months, or sooner if needed, with Lab testing done 3-5 days before

## 2018-03-06 NOTE — Assessment & Plan Note (Signed)
With some wt gain recent, for f/u labs o/w stable overall by history and exam, recent data reviewed with pt, and pt to continue medical treatment as before,  to f/u any worsening symptoms or concerns]

## 2018-03-18 DIAGNOSIS — H01021 Squamous blepharitis right upper eyelid: Secondary | ICD-10-CM | POA: Diagnosis not present

## 2018-03-18 DIAGNOSIS — H04123 Dry eye syndrome of bilateral lacrimal glands: Secondary | ICD-10-CM | POA: Diagnosis not present

## 2018-03-18 DIAGNOSIS — H2513 Age-related nuclear cataract, bilateral: Secondary | ICD-10-CM | POA: Diagnosis not present

## 2018-03-18 DIAGNOSIS — E119 Type 2 diabetes mellitus without complications: Secondary | ICD-10-CM | POA: Diagnosis not present

## 2018-03-18 LAB — HM DIABETES EYE EXAM

## 2018-03-21 ENCOUNTER — Other Ambulatory Visit: Payer: Self-pay | Admitting: *Deleted

## 2018-03-21 ENCOUNTER — Telehealth: Payer: Self-pay | Admitting: Internal Medicine

## 2018-03-21 MED ORDER — ROSUVASTATIN CALCIUM 40 MG PO TABS: 40.0000 mg | ORAL_TABLET | Freq: Every day | ORAL | 3 refills | Status: DC

## 2018-03-21 MED ORDER — PIOGLITAZONE HCL 45 MG PO TABS: ORAL_TABLET | ORAL | 3 refills | Status: DC

## 2018-03-21 MED ORDER — METFORMIN HCL 1000 MG PO TABS: 1000.0000 mg | ORAL_TABLET | Freq: Two times a day (BID) | ORAL | 3 refills | Status: DC

## 2018-03-21 MED ORDER — OLMESARTAN-AMLODIPINE-HCTZ 40-10-25 MG PO TABS: 1.0000 | ORAL_TABLET | ORAL | 3 refills | Status: DC

## 2018-03-21 NOTE — Telephone Encounter (Signed)
Rx refilled per protocol- LOV: 03/06/18

## 2018-03-21 NOTE — Telephone Encounter (Signed)
Copied from [place] [id number]. Topic: Quick Communication - Rx Refill/Question >> Mar 21, 2018  2:51 PM Oliver Pila B wrote: Medication: metFORMIN (GLUCOPHAGE) 1000 MG tablet [[id number]] , Olmesartan-Amlodipine-HCTZ 40-10-25 MG TABS [[id number]] , pioglitazone (ACTOS) 45 MG tablet [[id number]] , rosuvastatin (CRESTOR) 40 MG tablet [[id number]]  Has the patient contacted their pharmacy? Yes.   (Agent: If no, request that the patient contact the pharmacy for the refill.) Preferred Pharmacy (with phone number or street name): [facility] Agent: Please be advised that RX refills may take up to 3 business days. We ask that you follow-up with your pharmacy.

## 2018-09-10 ENCOUNTER — Ambulatory Visit: Payer: BLUE CROSS/BLUE SHIELD | Admitting: Internal Medicine

## 2018-09-10 ENCOUNTER — Other Ambulatory Visit (INDEPENDENT_AMBULATORY_CARE_PROVIDER_SITE_OTHER): Payer: BLUE CROSS/BLUE SHIELD

## 2018-09-10 ENCOUNTER — Encounter: Payer: Self-pay | Admitting: Internal Medicine

## 2018-09-10 VITALS — BP 136/84 | HR 84 | Temp 98.9°F | Ht 66.0 in | Wt 261.0 lb

## 2018-09-10 DIAGNOSIS — E785 Hyperlipidemia, unspecified: Secondary | ICD-10-CM | POA: Diagnosis not present

## 2018-09-10 DIAGNOSIS — E119 Type 2 diabetes mellitus without complications: Secondary | ICD-10-CM | POA: Diagnosis not present

## 2018-09-10 DIAGNOSIS — Z0000 Encounter for general adult medical examination without abnormal findings: Secondary | ICD-10-CM | POA: Diagnosis not present

## 2018-09-10 DIAGNOSIS — Z23 Encounter for immunization: Secondary | ICD-10-CM

## 2018-09-10 DIAGNOSIS — I10 Essential (primary) hypertension: Secondary | ICD-10-CM

## 2018-09-10 LAB — BASIC METABOLIC PANEL
BUN: 17 mg/dL (ref 6–23)
CALCIUM: 10 mg/dL (ref 8.4–10.5)
CO2: 30 mEq/L (ref 19–32)
CREATININE: 1.19 mg/dL (ref 0.40–1.50)
Chloride: 101 mEq/L (ref 96–112)
GFR: 78.5 mL/min (ref >60.00)
Glucose, Bld: 122 mg/dL — ABNORMAL HIGH (ref 70–99)
Potassium: 4 mEq/L (ref 3.5–5.1)
SODIUM: 140 meq/L (ref 135–145)

## 2018-09-10 LAB — LIPID PANEL
CHOL/HDL RATIO: 2
Cholesterol: 139 mg/dL (ref 0–200)
HDL: 84.9 mg/dL (ref >39.00)
LDL Cholesterol: 42 mg/dL (ref 0–99)
NonHDL: 53.7
Triglycerides: 57 mg/dL (ref 0.0–149.0)
VLDL: 11.4 mg/dL (ref 0.0–40.0)

## 2018-09-10 LAB — HEPATIC FUNCTION PANEL
ALK PHOS: 39 U/L (ref 39–117)
ALT: 29 U/L (ref 0–53)
AST: 37 U/L (ref 0–37)
Albumin: 4.4 g/dL (ref 3.5–5.2)
BILIRUBIN TOTAL: 0.4 mg/dL (ref 0.2–1.2)
Bilirubin, Direct: 0.1 mg/dL (ref 0.0–0.3)
Total Protein: 7.5 g/dL (ref 6.0–8.3)

## 2018-09-10 LAB — HEMOGLOBIN A1C: HEMOGLOBIN A1C: 7 % — AB (ref 4.6–6.5)

## 2018-09-10 NOTE — Assessment & Plan Note (Signed)
stable overall by history and exam, recent data reviewed with pt, and pt to continue medical treatment as before,  to f/u any worsening symptoms or concerns  

## 2018-09-10 NOTE — Assessment & Plan Note (Signed)
stable overall by history and exam, recent data reviewed with pt, and pt to continue medical treatment as before,  to f/u any worsening symptoms or concerns, goal < 7, consider add trulicity

## 2018-09-10 NOTE — Patient Instructions (Addendum)
You had the Pneumovax pneumonia shot today  Please continue all other medications as before, and refills have been done if requested.  Please have the pharmacy call with any other refills you may need.  Please continue your efforts at being more active, low cholesterol diabetic diet, and weight control.  Please keep your appointments with your specialists as you may have planned  Please go to the LAB in the Basement (turn left off the elevator) for the tests to be done today  You will be contacted by phone if any changes need to be made immediately.  Otherwise, you will receive a letter about your results with an explanation, but please check with MyChart first.  Please remember to sign up for MyChart if you have not done so, as this will be important to you in the future with finding out test results, communicating by private email, and scheduling acute appointments online when needed.  Please return in 6 months, or sooner if needed, with Lab testing done 3-5 days before

## 2018-09-10 NOTE — Progress Notes (Signed)
Subjective:    Patient ID: Marcus Rivera, male    DOB: 07-Feb-1951, 67 y.o.   MRN: [id number]  HPI  Here to f/u; overall doing ok,  Pt denies chest pain, increasing sob or doe, wheezing, orthopnea, PND, increased LE swelling, palpitations, dizziness or syncope.  Pt denies new neurological symptoms such as new headache, or facial or extremity weakness or numbness.  Pt denies polydipsia, polyuria, or low sugar episode.  Pt states overall good compliance with meds, mostly trying to follow appropriate diet, with wt overall stable,  but little exercise however. Somehow not taking the glimeparide. Good compliance usually in the past and recently with current meds.  BP Readings from Last 3 Encounters:  09/10/18 136/84  03/06/18 126/82  09/06/17 122/78    Past Medical History:  Diagnosis Date  . ANEMIA-NOS 10/03/2010  . Cancer [facility])    prostate  . DIABETES MELLITUS, TYPE II 04/12/2008  . GLUCOSE INTOLERANCE 03/12/2008  . Gout   . HYPERLIPIDEMIA 04/12/2008  . HYPERTENSION 03/12/2008  . PROSTATE CANCER, HX OF 09/05/2009  . PSA, INCREASED 03/18/2008   Past Surgical History:  Procedure Laterality Date  . COLONOSCOPY    . COLONOSCOPY N/A 11/29/2014   Procedure: COLONOSCOPY;  Surgeon: Inda Castle, MD;  Location: [facility];  Service: Endoscopy;  Laterality: N/A;  . negative stress echo  Oct. 17, 2011   [facility]  . PROSTATE SURGERY  2009    reports that he has quit smoking. He has never used smokeless tobacco. He reports that he does not drink alcohol or use drugs. family history includes Cancer in his mother; Colon cancer in his paternal aunt; Diabetes in his other; Hypertension in his other; Liver disease in his mother. No Known Allergies Current Outpatient Medications on File Prior to Visit  Medication Sig Dispense Refill  . aspirin 81 MG EC tablet Take 81 mg by mouth daily.      . Calcium-Vitamin D 500-125 MG-UNIT TABS Take 1 tablet by mouth daily.     . colchicine 0.6 MG  tablet Take 1 tablet (0.6 mg total) by mouth 2 (two) times daily as needed. 60 tablet 1  . metFORMIN (GLUCOPHAGE) 1000 MG tablet Take 1 tablet (1,000 mg total) by mouth 2 (two) times daily with a meal. 180 tablet 3  . Multiple Vitamin (MULTIVITAMIN) tablet Take 1 tablet by mouth daily.    . Olmesartan-amLODIPine-HCTZ 40-10-25 MG TABS Take 1 tablet by mouth every morning. 90 tablet 3  . ONETOUCH DELICA LANCETS 19F MISC Use to help check blood sugars twice a day Dx E11.9 300 each 3  . pioglitazone (ACTOS) 45 MG tablet TAKE 1 TABLET DAILY 90 tablet 3  . rosuvastatin (CRESTOR) 40 MG tablet Take 1 tablet (40 mg total) by mouth daily. 90 tablet 3   No current facility-administered medications on file prior to visit.    Review of Systems  Constitutional: Negative for other unusual diaphoresis or sweats HENT: Negative for ear discharge or swelling Eyes: Negative for other worsening visual disturbances Respiratory: Negative for stridor or other swelling  Gastrointestinal: Negative for worsening distension or other blood Genitourinary: Negative for retention or other urinary change Musculoskeletal: Negative for other MSK pain or swelling Skin: Negative for color change or other new lesions Neurological: Negative for worsening tremors and other numbness  Psychiatric/Behavioral: Negative for worsening agitation or other fatigue All other system neg per pt    Objective:   Physical Exam BP 136/84   Pulse 84  Temp 98.9 F (37.2 C) (Oral)   Ht 5\' 6"  (1.676 m)   Wt 261 lb (118.4 kg)   SpO2 91%   BMI 42.13 kg/m  VS noted,  Constitutional: Pt appears in NAD HENT: Head: NCAT.  Right Ear: External ear normal.  Left Ear: External ear normal.  Eyes: . Pupils are equal, round, and reactive to light. Conjunctivae and EOM are normal Nose: without d/c or deformity Neck: Neck supple. Gross normal ROM Cardiovascular: Normal rate and regular rhythm.   Pulmonary/Chest: Effort normal and breath sounds  without rales or wheezing.  Abd:  Soft, NT, ND, + BS, no organomegaly Neurological: Pt is alert. At baseline orientation, motor grossly intact Skin: Skin is warm. No rashes, other new lesions, no LE edema Psychiatric: Pt behavior is normal without agitation  No other exam findings Lab Results  Component Value Date   WBC 2.9 (L) 03/06/2018   HGB 11.2 (L) 03/06/2018   HCT 34.1 (L) 03/06/2018   PLT 233.0 03/06/2018   GLUCOSE 127 (H) 03/06/2018   CHOL 133 03/06/2018   TRIG 52.0 03/06/2018   HDL 88.30 03/06/2018   LDLDIRECT 139.7 09/05/2009   LDLCALC 34 03/06/2018   ALT 27 03/06/2018   AST 23 03/06/2018   NA 143 03/06/2018   K 5.2 (H) 03/06/2018   CL 103 03/06/2018   CREATININE 1.19 03/06/2018   BUN 19 03/06/2018   CO2 31 03/06/2018   TSH 1.46 03/06/2018   PSA 2.20 09/06/2017   HGBA1C 7.2 (H) 03/06/2018   MICROALBUR 0.7 03/06/2018       Assessment & Plan:

## 2019-03-12 ENCOUNTER — Other Ambulatory Visit: Payer: Self-pay

## 2019-03-12 ENCOUNTER — Ambulatory Visit (INDEPENDENT_AMBULATORY_CARE_PROVIDER_SITE_OTHER): Payer: BLUE CROSS/BLUE SHIELD | Admitting: Internal Medicine

## 2019-03-12 ENCOUNTER — Encounter: Payer: Self-pay | Admitting: Internal Medicine

## 2019-03-12 ENCOUNTER — Other Ambulatory Visit (INDEPENDENT_AMBULATORY_CARE_PROVIDER_SITE_OTHER): Payer: BLUE CROSS/BLUE SHIELD

## 2019-03-12 VITALS — BP 138/88 | HR 87 | Temp 98.8°F | Ht 66.0 in | Wt 274.0 lb

## 2019-03-12 DIAGNOSIS — Z0000 Encounter for general adult medical examination without abnormal findings: Secondary | ICD-10-CM

## 2019-03-12 DIAGNOSIS — Z8546 Personal history of malignant neoplasm of prostate: Secondary | ICD-10-CM | POA: Diagnosis not present

## 2019-03-12 DIAGNOSIS — E119 Type 2 diabetes mellitus without complications: Secondary | ICD-10-CM

## 2019-03-12 LAB — URINALYSIS, ROUTINE W REFLEX MICROSCOPIC
Bilirubin Urine: NEGATIVE
Hgb urine dipstick: NEGATIVE
Ketones, ur: NEGATIVE
Leukocytes,Ua: NEGATIVE
Nitrite: NEGATIVE
Specific Gravity, Urine: 1.02 (ref 1.000–1.030)
Total Protein, Urine: NEGATIVE
Urine Glucose: NEGATIVE
Urobilinogen, UA: 0.2 (ref 0.0–1.0)
pH: 7 (ref 5.0–8.0)

## 2019-03-12 LAB — BASIC METABOLIC PANEL
BUN: 14 mg/dL (ref 6–23)
CO2: 32 mEq/L (ref 19–32)
Calcium: 10 mg/dL (ref 8.4–10.5)
Chloride: 104 mEq/L (ref 96–112)
Creatinine, Ser: 1.12 mg/dL (ref 0.40–1.50)
GFR: 79.09 mL/min (ref >60.00)
Glucose, Bld: 125 mg/dL — ABNORMAL HIGH (ref 70–99)
Potassium: 5 mEq/L (ref 3.5–5.1)
Sodium: 142 mEq/L (ref 135–145)

## 2019-03-12 LAB — CBC WITH DIFFERENTIAL/PLATELET
Basophils Absolute: 0 10*3/uL (ref 0.0–0.1)
Basophils Relative: 0.6 % (ref 0.0–3.0)
Eosinophils Absolute: 0 10*3/uL (ref 0.0–0.7)
Eosinophils Relative: 1.4 % (ref 0.0–5.0)
HCT: 33.8 % — ABNORMAL LOW (ref 39.0–52.0)
Hemoglobin: 11.1 g/dL — ABNORMAL LOW (ref 13.0–17.0)
Lymphocytes Relative: 24.9 % (ref 12.0–46.0)
Lymphs Abs: 0.8 10*3/uL (ref 0.7–4.0)
MCHC: 32.8 g/dL (ref 30.0–36.0)
MCV: 77.8 fl — ABNORMAL LOW (ref 78.0–100.0)
Monocytes Absolute: 0.4 10*3/uL (ref 0.1–1.0)
Monocytes Relative: 13.5 % — ABNORMAL HIGH (ref 3.0–12.0)
Neutro Abs: 1.9 10*3/uL (ref 1.4–7.7)
Neutrophils Relative %: 59.6 % (ref 43.0–77.0)
Platelets: 210 10*3/uL (ref 150.0–400.0)
RBC: 4.35 Mil/uL (ref 4.22–5.81)
RDW: 16.1 % — ABNORMAL HIGH (ref 11.5–15.5)
WBC: 3.2 10*3/uL — ABNORMAL LOW (ref 4.0–10.5)

## 2019-03-12 LAB — MICROALBUMIN / CREATININE URINE RATIO
Creatinine,U: 53.2 mg/dL
Microalb Creat Ratio: 1.3 mg/g (ref 0.0–30.0)
Microalb, Ur: <0.7 mg/dL (ref 0.0–1.9)

## 2019-03-12 LAB — HEMOGLOBIN A1C: Hgb A1c MFr Bld: 7.2 % — ABNORMAL HIGH (ref 4.6–6.5)

## 2019-03-12 LAB — HEPATIC FUNCTION PANEL
ALT: 16 U/L (ref 0–53)
AST: 15 U/L (ref 0–37)
Albumin: 4.3 g/dL (ref 3.5–5.2)
Alkaline Phosphatase: 50 U/L (ref 39–117)
Bilirubin, Direct: 0.1 mg/dL (ref 0.0–0.3)
Total Bilirubin: 0.4 mg/dL (ref 0.2–1.2)
Total Protein: 7.2 g/dL (ref 6.0–8.3)

## 2019-03-12 LAB — LIPID PANEL
Cholesterol: 160 mg/dL (ref 0–200)
HDL: 87.2 mg/dL (ref >39.00)
LDL Cholesterol: 63 mg/dL (ref 0–99)
NonHDL: 72.34
Total CHOL/HDL Ratio: 2
Triglycerides: 45 mg/dL (ref 0.0–149.0)
VLDL: 9 mg/dL (ref 0.0–40.0)

## 2019-03-12 LAB — TSH: TSH: 1.48 u[IU]/mL (ref 0.35–4.50)

## 2019-03-12 NOTE — Assessment & Plan Note (Signed)
Recent psa good, to f/u urology as planned

## 2019-03-12 NOTE — Assessment & Plan Note (Signed)

## 2019-03-12 NOTE — Progress Notes (Signed)
Subjective:    Patient ID: Marcus Rivera, male    DOB: 01-14-1951, 68 y.o.   MRN: [id number]  HPI  Here for wellness and f/u;  Overall doing ok;  Pt denies Chest pain, worsening SOB, DOE, wheezing, orthopnea, PND, worsening LE edema, palpitations, dizziness or syncope.  Pt denies neurological change such as new headache, facial or extremity weakness.  Pt denies polydipsia, polyuria, or low sugar symptoms. Pt states overall good compliance with treatment and medications, good tolerability, and has been trying to follow appropriate diet.  Pt denies worsening depressive symptoms, suicidal ideation or panic. No fever, night sweats, wt loss, loss of appetite, or other constitutional symptoms.  Pt states good ability with ADL's, has low fall risk, home safety reviewed and adequate, no other significant changes in hearing or vision, and not active with exercise in last 3 wks, avoiding other people contact by not doing the 40 min walking every day, but plans to restart when social distancing is lifted.  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  Has eye exam f/u next week..  Last PSA 02/03/2019 per urology < 0.15.   Past Medical History:  Diagnosis Date  . ANEMIA-NOS 10/03/2010  . Cancer [facility])    prostate  . DIABETES MELLITUS, TYPE II 04/12/2008  . GLUCOSE INTOLERANCE 03/12/2008  . Gout   . HYPERLIPIDEMIA 04/12/2008  . HYPERTENSION 03/12/2008  . PROSTATE CANCER, HX OF 09/05/2009  . PSA, INCREASED 03/18/2008   Past Surgical History:  Procedure Laterality Date  . COLONOSCOPY    . COLONOSCOPY N/A 11/29/2014   Procedure: COLONOSCOPY;  Surgeon: Inda Castle, MD;  Location: [facility];  Service: Endoscopy;  Laterality: N/A;  . negative stress echo  Oct. 17, 2011   [facility]  . PROSTATE SURGERY  2009    reports that he has quit smoking. He has never used smokeless tobacco. He reports that he does not drink alcohol or use drugs. family  history includes Cancer in his mother; Colon cancer in his paternal aunt; Diabetes in an other family member; Hypertension in an other family member; Liver disease in his mother. No Known Allergies Current Outpatient Medications on File Prior to Visit  Medication Sig Dispense Refill  . aspirin 81 MG EC tablet Take 81 mg by mouth daily.      . Calcium-Vitamin D 500-125 MG-UNIT TABS Take 1 tablet by mouth daily.     . colchicine 0.6 MG tablet Take 1 tablet (0.6 mg total) by mouth 2 (two) times daily as needed. 60 tablet 1  . metFORMIN (GLUCOPHAGE) 1000 MG tablet Take 1 tablet (1,000 mg total) by mouth 2 (two) times daily with a meal. 180 tablet 3  . Multiple Vitamin (MULTIVITAMIN) tablet Take 1 tablet by mouth daily.    . Olmesartan-amLODIPine-HCTZ 40-10-25 MG TABS Take 1 tablet by mouth every morning. 90 tablet 3  . ONETOUCH DELICA LANCETS 78G MISC Use to help check blood sugars twice a day Dx E11.9 300 each 3  . pioglitazone (ACTOS) 45 MG tablet TAKE 1 TABLET DAILY 90 tablet 3  . rosuvastatin (CRESTOR) 40 MG tablet Take 1 tablet (40 mg total) by mouth daily. 90 tablet 3   No current facility-administered medications on file prior to visit.    Review of Systems Constitutional: Negative for other unusual diaphoresis, sweats, appetite or weight changes HENT: Negative for other worsening hearing loss, ear pain, facial swelling, mouth sores or  neck stiffness.   Eyes: Negative for other worsening pain, redness or other visual disturbance.  Respiratory: Negative for other stridor or swelling Cardiovascular: Negative for other palpitations or other chest pain  Gastrointestinal: Negative for worsening diarrhea or loose stools, blood in stool, distention or other pain Genitourinary: Negative for hematuria, flank pain or other change in urine volume.  Musculoskeletal: Negative for myalgias or other joint swelling.  Skin: Negative for other color change, or other wound or worsening drainage.   Neurological: Negative for other syncope or numbness. Hematological: Negative for other adenopathy or swelling Psychiatric/Behavioral: Negative for hallucinations, other worsening agitation, SI, self-injury, or new decreased concentration All other system neg per pt    Objective:   Physical Exam BP 138/88   Pulse 87   Temp 98.8 F (37.1 C) (Oral)   Ht 5\' 6"  (1.676 m)   Wt 274 lb (124.3 kg)   SpO2 95%   BMI 44.22 kg/m  VS noted, morbid obese Constitutional: Pt is oriented to person, place, and time. Appears well-developed and well-nourished, in no significant distress and comfortable Head: Normocephalic and atraumatic  Eyes: Conjunctivae and EOM are normal. Pupils are equal, round, and reactive to light Right Ear: External ear normal without discharge Left Ear: External ear normal without discharge Nose: Nose without discharge or deformity Mouth/Throat: Oropharynx is without other ulcerations and moist  Neck: Normal range of motion. Neck supple. No JVD present. No tracheal deviation present or significant neck LA or mass Cardiovascular: Normal rate, regular rhythm, normal heart sounds and intact distal pulses.   Pulmonary/Chest: WOB normal and breath sounds without rales or wheezing  Abdominal: Soft. Bowel sounds are normal. NT. No HSM  Musculoskeletal: Normal range of motion. Exhibits no edema Lymphadenopathy: Has no other cervical adenopathy.  Neurological: Pt is alert and oriented to person, place, and time. Pt has normal reflexes. No cranial nerve deficit. Motor grossly intact, Gait intact Skin: Skin is warm and dry. No rash noted or new ulcerations Psychiatric:  Has normal mood and affect. Behavior is normal without agitation No other exam findings Lab Results  Component Value Date   WBC 2.9 (L) 03/06/2018   HGB 11.2 (L) 03/06/2018   HCT 34.1 (L) 03/06/2018   PLT 233.0 03/06/2018   GLUCOSE 122 (H) 09/10/2018   CHOL 139 09/10/2018   TRIG 57.0 09/10/2018   HDL 84.90  09/10/2018   LDLDIRECT 139.7 09/05/2009   LDLCALC 42 09/10/2018   ALT 29 09/10/2018   AST 37 09/10/2018   NA 140 09/10/2018   K 4.0 09/10/2018   CL 101 09/10/2018   CREATININE 1.19 09/10/2018   BUN 17 09/10/2018   CO2 30 09/10/2018   TSH 1.46 03/06/2018   PSA 2.20 09/06/2017   HGBA1C 7.0 (H) 09/10/2018   MICROALBUR 0.7 03/06/2018       Assessment & Plan:

## 2019-03-12 NOTE — Patient Instructions (Signed)
Please continue all other medications as before, and refills have been done if requested.  Please have the pharmacy call with any other refills you may need.  Please continue your efforts at being more active, low cholesterol diet, and weight control.  You are otherwise up to date with prevention measures today.  Please keep your appointments with your specialists as you may have planned  Please go to the LAB in the Basement (turn left off the elevator) for the tests to be done today  You will be contacted by phone if any changes need to be made immediately.  Otherwise, you will receive a letter about your results with an explanation, but please check with MyChart first.  In order to help keep our patients safe and at home we are billing the insurance company for a health consult. You could potentially get a copay to a maximum of $15. Do you agree to this in order to obtain our advice about your concerns?  Please return in 6 months, or sooner if needed, with Lab testing done 3-5 days before

## 2019-03-12 NOTE — Assessment & Plan Note (Signed)
stable overall by history and exam, recent data reviewed with pt, and pt to continue medical treatment as before,  to f/u any worsening symptoms or concerns, for a1c with labs 

## 2019-04-09 ENCOUNTER — Telehealth: Payer: Self-pay | Admitting: Internal Medicine

## 2019-04-09 MED ORDER — OLMESARTAN-AMLODIPINE-HCTZ 40-10-25 MG PO TABS: 1.0000 | ORAL_TABLET | ORAL | 3 refills | Status: DC

## 2019-04-09 MED ORDER — METFORMIN HCL 1000 MG PO TABS: 1000.0000 mg | ORAL_TABLET | Freq: Two times a day (BID) | ORAL | 3 refills | Status: DC

## 2019-04-09 MED ORDER — PIOGLITAZONE HCL 45 MG PO TABS: ORAL_TABLET | ORAL | 3 refills | Status: DC

## 2019-04-09 MED ORDER — ROSUVASTATIN CALCIUM 40 MG PO TABS: 40.0000 mg | ORAL_TABLET | Freq: Every day | ORAL | 3 refills | Status: DC

## 2019-04-09 NOTE — Telephone Encounter (Signed)
Refills sent. See meds.  

## 2019-04-09 NOTE — Telephone Encounter (Signed)
Copied from [place] [phone]. Topic: Quick Communication - Rx Refill/Question >> Apr 09, 2019  1:56 PM Celene Kras A wrote: Medication: metformin, pioglitazone, rovustatin, olmestartan- amlodipine  Has the patient contacted their pharmacy? Yes.   (Agent: If no, request that the patient contact the pharmacy for the refill.) (Agent: If yes, when and what did the pharmacy advise?)  Preferred Pharmacy (with phone number or street name):   [id number] [address], [place] [address] Phone: [phone] Fax: [phone] Not a 24 hour pharmacy; exact hours not known.    Agent: Please be advised that RX refills may take up to 3 business days. We ask that you follow-up with your pharmacy.

## 2019-06-22 DIAGNOSIS — H04123 Dry eye syndrome of bilateral lacrimal glands: Secondary | ICD-10-CM | POA: Diagnosis not present

## 2019-06-22 DIAGNOSIS — H2513 Age-related nuclear cataract, bilateral: Secondary | ICD-10-CM | POA: Diagnosis not present

## 2019-06-22 DIAGNOSIS — E119 Type 2 diabetes mellitus without complications: Secondary | ICD-10-CM | POA: Diagnosis not present

## 2019-06-22 DIAGNOSIS — H0102A Squamous blepharitis right eye, upper and lower eyelids: Secondary | ICD-10-CM | POA: Diagnosis not present

## 2019-06-22 LAB — HM DIABETES EYE EXAM

## 2019-06-27 DIAGNOSIS — Z79899 Other long term (current) drug therapy: Secondary | ICD-10-CM | POA: Diagnosis not present

## 2019-06-27 DIAGNOSIS — Z8546 Personal history of malignant neoplasm of prostate: Secondary | ICD-10-CM | POA: Diagnosis not present

## 2019-06-27 DIAGNOSIS — Z87891 Personal history of nicotine dependence: Secondary | ICD-10-CM | POA: Diagnosis not present

## 2019-06-27 DIAGNOSIS — R220 Localized swelling, mass and lump, head: Secondary | ICD-10-CM | POA: Diagnosis not present

## 2019-06-27 DIAGNOSIS — E119 Type 2 diabetes mellitus without complications: Secondary | ICD-10-CM | POA: Diagnosis not present

## 2019-06-27 DIAGNOSIS — I10 Essential (primary) hypertension: Secondary | ICD-10-CM | POA: Diagnosis not present

## 2019-06-27 DIAGNOSIS — Z7984 Long term (current) use of oral hypoglycemic drugs: Secondary | ICD-10-CM | POA: Diagnosis not present

## 2019-06-27 DIAGNOSIS — K219 Gastro-esophageal reflux disease without esophagitis: Secondary | ICD-10-CM | POA: Diagnosis not present

## 2019-06-27 DIAGNOSIS — K047 Periapical abscess without sinus: Secondary | ICD-10-CM | POA: Diagnosis not present

## 2019-06-27 DIAGNOSIS — K0889 Other specified disorders of teeth and supporting structures: Secondary | ICD-10-CM | POA: Diagnosis not present

## 2019-06-27 DIAGNOSIS — Z7982 Long term (current) use of aspirin: Secondary | ICD-10-CM | POA: Diagnosis not present

## 2019-08-04 DIAGNOSIS — C61 Malignant neoplasm of prostate: Secondary | ICD-10-CM | POA: Diagnosis not present

## 2019-09-11 ENCOUNTER — Encounter: Payer: Self-pay | Admitting: Internal Medicine

## 2019-09-11 ENCOUNTER — Ambulatory Visit (INDEPENDENT_AMBULATORY_CARE_PROVIDER_SITE_OTHER): Payer: BC Managed Care – PPO | Admitting: Internal Medicine

## 2019-09-11 ENCOUNTER — Other Ambulatory Visit (INDEPENDENT_AMBULATORY_CARE_PROVIDER_SITE_OTHER): Payer: BC Managed Care – PPO

## 2019-09-11 ENCOUNTER — Other Ambulatory Visit: Payer: Self-pay

## 2019-09-11 VITALS — BP 126/84 | HR 82 | Temp 98.3°F | Ht 66.0 in | Wt 256.0 lb

## 2019-09-11 DIAGNOSIS — E538 Deficiency of other specified B group vitamins: Secondary | ICD-10-CM

## 2019-09-11 DIAGNOSIS — K625 Hemorrhage of anus and rectum: Secondary | ICD-10-CM | POA: Insufficient documentation

## 2019-09-11 DIAGNOSIS — E611 Iron deficiency: Secondary | ICD-10-CM

## 2019-09-11 DIAGNOSIS — Z0000 Encounter for general adult medical examination without abnormal findings: Secondary | ICD-10-CM

## 2019-09-11 DIAGNOSIS — I10 Essential (primary) hypertension: Secondary | ICD-10-CM

## 2019-09-11 DIAGNOSIS — Z23 Encounter for immunization: Secondary | ICD-10-CM

## 2019-09-11 DIAGNOSIS — E559 Vitamin D deficiency, unspecified: Secondary | ICD-10-CM | POA: Diagnosis not present

## 2019-09-11 DIAGNOSIS — E119 Type 2 diabetes mellitus without complications: Secondary | ICD-10-CM

## 2019-09-11 DIAGNOSIS — E785 Hyperlipidemia, unspecified: Secondary | ICD-10-CM | POA: Diagnosis not present

## 2019-09-11 DIAGNOSIS — K649 Unspecified hemorrhoids: Secondary | ICD-10-CM | POA: Insufficient documentation

## 2019-09-11 LAB — IBC PANEL
Iron: 59 ug/dL (ref 42–165)
Saturation Ratios: 15.8 % — ABNORMAL LOW (ref 20.0–50.0)
Transferrin: 266 mg/dL (ref 212.0–360.0)

## 2019-09-11 LAB — BASIC METABOLIC PANEL
BUN: 15 mg/dL (ref 6–23)
CO2: 31 mEq/L (ref 19–32)
Calcium: 10.8 mg/dL — ABNORMAL HIGH (ref 8.4–10.5)
Chloride: 103 mEq/L (ref 96–112)
Creatinine, Ser: 1.45 mg/dL (ref 0.40–1.50)
GFR: 58.62 mL/min — ABNORMAL LOW (ref >60.00)
Glucose, Bld: 109 mg/dL — ABNORMAL HIGH (ref 70–99)
Potassium: 4.7 mEq/L (ref 3.5–5.1)
Sodium: 143 mEq/L (ref 135–145)

## 2019-09-11 LAB — HEPATIC FUNCTION PANEL
ALT: 13 U/L (ref 0–53)
AST: 20 U/L (ref 0–37)
Albumin: 4.6 g/dL (ref 3.5–5.2)
Alkaline Phosphatase: 39 U/L (ref 39–117)
Bilirubin, Direct: 0.1 mg/dL (ref 0.0–0.3)
Total Bilirubin: 0.5 mg/dL (ref 0.2–1.2)
Total Protein: 7.6 g/dL (ref 6.0–8.3)

## 2019-09-11 LAB — HEMOGLOBIN A1C: Hgb A1c MFr Bld: 6.6 % — ABNORMAL HIGH (ref 4.6–6.5)

## 2019-09-11 LAB — CBC WITH DIFFERENTIAL/PLATELET
Basophils Absolute: 0 10*3/uL (ref 0.0–0.1)
Basophils Relative: 0.7 % (ref 0.0–3.0)
Eosinophils Absolute: 0.1 10*3/uL (ref 0.0–0.7)
Eosinophils Relative: 1.8 % (ref 0.0–5.0)
HCT: 35.6 % — ABNORMAL LOW (ref 39.0–52.0)
Hemoglobin: 11.5 g/dL — ABNORMAL LOW (ref 13.0–17.0)
Lymphocytes Relative: 29.5 % (ref 12.0–46.0)
Lymphs Abs: 0.8 10*3/uL (ref 0.7–4.0)
MCHC: 32.4 g/dL (ref 30.0–36.0)
MCV: 79.1 fl (ref 78.0–100.0)
Monocytes Absolute: 0.4 10*3/uL (ref 0.1–1.0)
Monocytes Relative: 13.7 % — ABNORMAL HIGH (ref 3.0–12.0)
Neutro Abs: 1.5 10*3/uL (ref 1.4–7.7)
Neutrophils Relative %: 54.3 % (ref 43.0–77.0)
Platelets: 235 10*3/uL (ref 150.0–400.0)
RBC: 4.5 Mil/uL (ref 4.22–5.81)
RDW: 15.8 % — ABNORMAL HIGH (ref 11.5–15.5)
WBC: 2.8 10*3/uL — ABNORMAL LOW (ref 4.0–10.5)

## 2019-09-11 LAB — LIPID PANEL
Cholesterol: 126 mg/dL (ref 0–200)
HDL: 79.4 mg/dL (ref >39.00)
LDL Cholesterol: 35 mg/dL (ref 0–99)
NonHDL: 46.37
Total CHOL/HDL Ratio: 2
Triglycerides: 59 mg/dL (ref 0.0–149.0)
VLDL: 11.8 mg/dL (ref 0.0–40.0)

## 2019-09-11 LAB — VITAMIN D 25 HYDROXY (VIT D DEFICIENCY, FRACTURES): VITD: 70.07 ng/mL (ref 30.00–100.00)

## 2019-09-11 LAB — VITAMIN B12: Vitamin B-12: 723 pg/mL (ref 211–911)

## 2019-09-11 MED ORDER — HYDROCORTISONE ACETATE 25 MG RE SUPP: 25.0000 mg | Freq: Two times a day (BID) | RECTAL | 1 refills | Status: DC

## 2019-09-11 NOTE — Assessment & Plan Note (Signed)
Small volume transient x 3 wks ago, declines GI referral

## 2019-09-11 NOTE — Assessment & Plan Note (Signed)
stable overall by history and exam, recent data reviewed with pt, and pt to continue medical treatment as before,  to f/u any worsening symptoms or concerns  

## 2019-09-11 NOTE — Progress Notes (Signed)
Subjective:    Patient ID: Marcus Rivera, male    DOB: 1951/03/14, 68 y.o.   MRN: [id number]  HPI  Here to f/u; overall doing ok,  Pt denies chest pain, increasing sob or doe, wheezing, orthopnea, PND, increased LE swelling, palpitations, dizziness or syncope.  Pt denies new neurological symptoms such as new headache, or facial or extremity weakness or numbness.  Pt denies polydipsia, polyuria, or low sugar episode.  Pt states overall good compliance with meds, mostly trying to follow appropriate diet, with wt overall stable,  but  exercise however has been increased to walks daiy.  Denies worsening reflux, abd pain, dysphagia, n/v, bowel change but has had some itching then small amounts BRBPR somewhat better with prep H and no further BRB for about 3 wks.   Wt Readings from Last 3 Encounters:  09/11/19 256 lb (116.1 kg)  03/12/19 274 lb (124.3 kg)  09/10/18 261 lb (118.4 kg)   Past Medical History:  Diagnosis Date  . ANEMIA-NOS 10/03/2010  . Cancer [facility])    prostate  . DIABETES MELLITUS, TYPE II 04/12/2008  . GLUCOSE INTOLERANCE 03/12/2008  . Gout   . HYPERLIPIDEMIA 04/12/2008  . HYPERTENSION 03/12/2008  . PROSTATE CANCER, HX OF 09/05/2009  . PSA, INCREASED 03/18/2008   Past Surgical History:  Procedure Laterality Date  . COLONOSCOPY    . COLONOSCOPY N/A 11/29/2014   Procedure: COLONOSCOPY;  Surgeon: Inda Castle, MD;  Location: [facility];  Service: Endoscopy;  Laterality: N/A;  . negative stress echo  Oct. 17, 2011   [facility]  . PROSTATE SURGERY  2009    reports that he has quit smoking. He has never used smokeless tobacco. He reports that he does not drink alcohol or use drugs. family history includes Cancer in his mother; Colon cancer in his paternal aunt; Diabetes in an other family member; Hypertension in an other family member; Liver disease in his mother. No Known Allergies Current Outpatient Medications on File Prior to Visit  Medication Sig Dispense  Refill  . aspirin 81 MG EC tablet Take 81 mg by mouth daily.      . Calcium-Vitamin D 500-125 MG-UNIT TABS Take 1 tablet by mouth daily.     . colchicine 0.6 MG tablet Take 1 tablet (0.6 mg total) by mouth 2 (two) times daily as needed. 60 tablet 1  . metFORMIN (GLUCOPHAGE) 1000 MG tablet Take 1 tablet (1,000 mg total) by mouth 2 (two) times daily with a meal. 180 tablet 3  . Multiple Vitamin (MULTIVITAMIN) tablet Take 1 tablet by mouth daily.    . Olmesartan-amLODIPine-HCTZ 40-10-25 MG TABS Take 1 tablet by mouth every morning. 90 tablet 3  . ONETOUCH DELICA LANCETS 99991111 MISC Use to help check blood sugars twice a day Dx E11.9 300 each 3  . pioglitazone (ACTOS) 45 MG tablet TAKE 1 TABLET DAILY 90 tablet 3  . rosuvastatin (CRESTOR) 40 MG tablet Take 1 tablet (40 mg total) by mouth daily. 90 tablet 3   No current facility-administered medications on file prior to visit.    Review of Systems  Constitutional: Negative for other unusual diaphoresis or sweats HENT: Negative for ear discharge or swelling Eyes: Negative for other worsening visual disturbances Respiratory: Negative for stridor or other swelling  Gastrointestinal: Negative for worsening distension or other blood Genitourinary: Negative for retention or other urinary change Musculoskeletal: Negative for other MSK pain or swelling Skin: Negative for color change or other new lesions Neurological: Negative for  worsening tremors and other numbness  Psychiatric/Behavioral: Negative for worsening agitation or other fatigue All otherwise neg per pt     Objective:   Physical Exam BP 126/84   Pulse 82   Temp 98.3 F (36.8 C) (Oral)   Ht 5\' 6"  (1.676 m)   Wt 256 lb (116.1 kg)   SpO2 96%   BMI 41.32 kg/m  VS noted,  Constitutional: Pt appears in NAD HENT: Head: NCAT.  Right Ear: External ear normal.  Left Ear: External ear normal.  Eyes: . Pupils are equal, round, and reactive to light. Conjunctivae and EOM are normal Nose:  without d/c or deformity Neck: Neck supple. Gross normal ROM Cardiovascular: Normal rate and regular rhythm.   Pulmonary/Chest: Effort normal and breath sounds without rales or wheezing.  Abd:  Soft, NT, ND, + BS, no organomegaly Neurological: Pt is alert. At baseline orientation, motor grossly intact Skin: Skin is warm. No rashes, other new lesions, no LE edema Psychiatric: Pt behavior is normal without agitation  All otherwise neg per pt Lab Results  Component Value Date   WBC 3.2 (L) 03/12/2019   HGB 11.1 (L) 03/12/2019   HCT 33.8 (L) 03/12/2019   PLT 210.0 03/12/2019   GLUCOSE 125 (H) 03/12/2019   CHOL 160 03/12/2019   TRIG 45.0 03/12/2019   HDL 87.20 03/12/2019   LDLDIRECT 139.7 09/05/2009   LDLCALC 63 03/12/2019   ALT 16 03/12/2019   AST 15 03/12/2019   NA 142 03/12/2019   K 5.0 03/12/2019   CL 104 03/12/2019   CREATININE 1.12 03/12/2019   BUN 14 03/12/2019   CO2 32 03/12/2019   TSH 1.48 03/12/2019   PSA 2.20 09/06/2017   HGBA1C 7.2 (H) 03/12/2019   MICROALBUR <0.7 03/12/2019          Assessment & Plan:

## 2019-09-11 NOTE — Patient Instructions (Addendum)
You had the flu shot today  Please take all new medication as prescribed - the suppository if needed  Please continue all other medications as before, and refills have been done if requested.  Please have the pharmacy call with any other refills you may need.  Please continue your efforts at being more active, low cholesterol diet, and weight control.  Please keep your appointments with your specialists as you may have planned  Please go to the LAB in the Basement (turn left off the elevator) for the tests to be done today  You will be contacted by phone if any changes need to be made immediately.  Otherwise, you will receive a letter about your results with an explanation, but please check with MyChart first.  Please remember to sign up for MyChart if you have not done so, as this will be important to you in the future with finding out test results, communicating by private email, and scheduling acute appointments online when needed.  Please return in 6 months, or sooner if needed, with Lab testing done 3-5 days before

## 2019-09-11 NOTE — Assessment & Plan Note (Signed)
Ok for [employer] [facility] supp

## 2019-10-26 DIAGNOSIS — C61 Malignant neoplasm of prostate: Secondary | ICD-10-CM | POA: Diagnosis not present

## 2019-11-02 DIAGNOSIS — C61 Malignant neoplasm of prostate: Secondary | ICD-10-CM | POA: Diagnosis not present

## 2019-11-09 ENCOUNTER — Other Ambulatory Visit (HOSPITAL_COMMUNITY): Payer: Self-pay | Admitting: Urology

## 2019-11-09 ENCOUNTER — Other Ambulatory Visit: Payer: Self-pay | Admitting: Urology

## 2019-11-09 DIAGNOSIS — C61 Malignant neoplasm of prostate: Secondary | ICD-10-CM

## 2019-11-13 DIAGNOSIS — C61 Malignant neoplasm of prostate: Secondary | ICD-10-CM | POA: Diagnosis not present

## 2019-11-23 ENCOUNTER — Other Ambulatory Visit: Payer: Self-pay

## 2019-11-23 ENCOUNTER — Ambulatory Visit (HOSPITAL_COMMUNITY)
Admission: RE | Admit: 2019-11-23 | Discharge: 2019-11-23 | Disposition: A | Discharge status: Home / Self Care | Payer: BC Managed Care – PPO | Source: Ambulatory Visit | Attending: Urology | Admitting: Urology

## 2019-11-23 DIAGNOSIS — C61 Malignant neoplasm of prostate: Secondary | ICD-10-CM | POA: Diagnosis not present

## 2019-11-23 MED ORDER — TECHNETIUM TC 99M MEDRONATE IV KIT
20.0000 | PACK | Freq: Once | INTRAVENOUS | Status: AC | PRN
  Administered 2019-11-23: 12:00:00 19.8 via INTRAVENOUS

## 2019-12-14 ENCOUNTER — Encounter: Payer: Self-pay | Admitting: Radiation Oncology

## 2019-12-14 NOTE — Progress Notes (Signed)
GU Location of Tumor / Histology: prostatic adenocarcinoma  If Prostate Cancer, Gleason Score is (3 + 4) and PSA is (38) at diagnosis.  JORIS ARRUE status post RALP 2009 referred by [facility] clinic for second opinion to Dr. Lovena Neighbours. His surgical pathology showed disease extension into his lymph nodes and his postsurgical PSA was 0.5. The patient did not have adjuvant radiation therapy following his prostatectomy. PSA was doubled from 0.15 in August 2020 to 0.32 in November 2020.   RALP pathology:  Past/Anticipated interventions by urology, if any: prostate biopsy, RALP, bone scan (negative), surveillance PSA, referral to Dr. Tammi Klippel for consideration of radiotherapy  Past/Anticipated interventions by medical oncology, if any: no  Weight changes, if any: no  Bowel/Bladder complaints, if any: IPSS 3 related to nocturia. SHIM 1. Denies urinary incontinence or leakage. Denies dysuria or hematuria. Reports hemorrhoids but denies GI referral recently.   Nausea/Vomiting, if any: no  Pain issues, if any:  denies  SAFETY ISSUES:  Prior radiation? denies  Pacemaker/ICD? denies  Possible current pregnancy? no, male patient  Is the patient on methotrexate? no  Current Complaints / other details:  69 year old male. Reunited with wife and has three children.   Mother deceased with history of lung and breast ca. Father deceased with history of prostate ca.

## 2019-12-15 ENCOUNTER — Encounter: Payer: Self-pay | Admitting: Radiation Oncology

## 2019-12-15 ENCOUNTER — Other Ambulatory Visit: Payer: Self-pay

## 2019-12-15 ENCOUNTER — Ambulatory Visit
Admission: RE | Admit: 2019-12-15 | Discharge: 2019-12-15 | Disposition: A | Discharge status: Home / Self Care | Payer: BC Managed Care – PPO | Source: Ambulatory Visit | Attending: Radiation Oncology | Admitting: Radiation Oncology

## 2019-12-15 VITALS — Ht 67.0 in | Wt 245.0 lb

## 2019-12-15 DIAGNOSIS — C61 Malignant neoplasm of prostate: Secondary | ICD-10-CM

## 2019-12-15 HISTORY — DX: Malignant neoplasm of prostate: C61

## 2019-12-15 NOTE — Progress Notes (Signed)
Radiation Oncology         (336) [phone] ________________________________  Initial outpatient Consultation - Conducted via MyChart due to current BHALP-37 concerns for limiting patient exposure  Name: Marcus Rivera MRN: [id number]  Date: 12/15/2019  DOB: 1951-10-07  HG:DJME, Hunt Oris, MD  [name], MD   REFERRING PHYSICIAN: [name], MD, Ellison Hughs, MD  DIAGNOSIS: 69 y.o. gentleman with rising, detectable PSA at 0.32 s/p RALP in 2009 for Stage pT2c, pN1, pMX Gleason 3+4 prostate cancer with pre-treatment PSA of 37.92.    ICD-10-CM   1. Malignant neoplasm of prostate ([place])  C61     HISTORY OF PRESENT ILLNESS: Marcus Rivera is a 69 y.o. male with a diagnosis of prostate cancer. He was initially seen in early 2009 by Dr. Risa Grill for elevated PSA at 37.92. Biopsy performed on 04/15/2008 showed Gleason 3+4 disease with some perineural invasion. He opted to pursue radical prostatectomy with lymph node dissection on 08/09/2008. Surgical pathology confirmed pT2cN1M0, Gleason 3+4 disease, which extended to bilateral apical margins with angiolymphatic invasion and out of seven total biopsied lymph nodes, one left pelvic lymph node was positive for metastatic adenocarcinoma (1/7).  His postoperative PSA was 0.5, and he was started on Lupron for ADT at that time. His PSA responded appropriately, after which the patient was started on intermittent ADT, with his most recent ADT injection recieved on 05/01/2018. He has continued in routine follow up under the care and direction of Dr. Lovena Neighbours since Dr. Risa Grill transitioned out of clinical practice. He reports that he never discussed salvage radiation therapy as an option previously.  Most recent PSA's: 08/2013 - 0.30 02/2014 - 0.50 09/2014 - 0.59 03/2015 - 0.70 01/2018 - 0.24 10/2018 - undetectable 01/2019 - undetectable 04/2019 - 0.065 07/2019 - 0.15 10/2019 - 0.32  He met with Dr. Junious Silk in the [facility] clinic at [facility]  for second opinion and the recommendation was to proceed with disease restaging prior to making formal recommendations for treatment.  Disease restaging imaging with CT Pelvis and Bone scan were performed recently.  The CT pelvis on 11/13/2019 showed no evidence of visceral metastatic disease and bone scan on 11/23/2019 was also negative for metastatic disease.  The patient reviewed the recent PSA and imaging results with his urologist and he has kindly been referred today for discussion of potential salvage radiation treatment options.   PREVIOUS RADIATION THERAPY: No  PAST MEDICAL HISTORY:  Past Medical History:  Diagnosis Date  . ANEMIA-NOS 10/03/2010  . DIABETES MELLITUS, TYPE II 04/12/2008  . GLUCOSE INTOLERANCE 03/12/2008  . Gout   . HYPERLIPIDEMIA 04/12/2008  . HYPERTENSION 03/12/2008  . Prostate cancer ([place])   . PROSTATE CANCER, HX OF 09/05/2009  . PSA, INCREASED 03/18/2008      PAST SURGICAL HISTORY: Past Surgical History:  Procedure Laterality Date  . COLONOSCOPY    . COLONOSCOPY N/A 11/29/2014   Procedure: COLONOSCOPY;  Surgeon: Inda Castle, MD;  Location: [facility];  Service: Endoscopy;  Laterality: N/A;  . negative stress echo  Oct. 17, 2011   [facility]  . PROSTATE SURGERY  2009    FAMILY HISTORY:  Family History  Problem Relation Age of Onset  . Cancer Mother        carcinoid tumor 1991,  . Liver disease Mother        s/p partial liver resection  . Lung cancer Mother   . Breast cancer Mother   . Hypertension Other   . Diabetes  Other   . Colon cancer Paternal Aunt   . Prostate cancer Father   . Rectal cancer Neg Hx   . Stomach cancer Neg Hx     SOCIAL HISTORY:  Social History   Socioeconomic History  . Marital status: Married    Spouse name: Not on file  . Number of children: 3  . Years of education: Not on file  . Highest education level: Not on file  Occupational History  . Occupation: Copy  Tobacco Use  . Smoking status: Former Smoker    Packs/day: 1.00    Years: 16.00    Pack years: 16.00    Types: Cigarettes    Quit date: 12/10/1988    Years since quitting: 31.0  . Smokeless tobacco: Never Used  Substance and Sexual Activity  . Alcohol use: No  . Drug use: No  . Sexual activity: Not Currently  Other Topics Concern  . Not on file  Social History Narrative  . Not on file   Social Determinants of Health   Financial Resource Strain:   . Difficulty of Paying Living Expenses: Not on file  Food Insecurity:   . Worried About Charity fundraiser in the Last Year: Not on file  . Ran Out of Food in the Last Year: Not on file  Transportation Needs:   . Lack of Transportation (Medical): Not on file  . Lack of Transportation (Non-Medical): Not on file  Physical Activity:   . Days of Exercise per Week: Not on file  . Minutes of Exercise per Session: Not on file  Stress:   . Feeling of Stress : Not on file  Social Connections:   . Frequency of Communication with Friends and Family: Not on file  . Frequency of Social Gatherings with Friends and Family: Not on file  . Attends Religious Services: Not on file  . Active Member of Clubs or Organizations: Not on file  . Attends Archivist Meetings: Not on file  . Marital Status: Not on file  Intimate Partner Violence:   . Fear of Current or Ex-Partner: Not on file  . Emotionally Abused: Not on file  . Physically Abused: Not on file  . Sexually Abused: Not on file    ALLERGIES: Patient has no known allergies.  MEDICATIONS:  Current Outpatient Medications  Medication Sig Dispense Refill  . ascorbic acid (VITAMIN C) 1000 MG tablet Take by mouth.    [name] aspirin 81 MG EC tablet Take 81 mg by mouth daily. Reports taking this medication approximately twice per week.    . Calcium-Vitamin D 500-125 MG-UNIT TABS Take 1 tablet by mouth daily.     [name] ibuprofen (ADVIL) 800 MG tablet Take 800 mg by mouth every 6  (six) hours as needed.    . Magnesium 400 MG CAPS Take by mouth.    . metFORMIN (GLUCOPHAGE) 1000 MG tablet Take 1 tablet (1,000 mg total) by mouth 2 (two) times daily with a meal. 180 tablet 3  . Olmesartan-amLODIPine-HCTZ 40-10-25 MG TABS Take 1 tablet by mouth every morning. 90 tablet 3  . ONETOUCH DELICA LANCETS 40X MISC Use to help check blood sugars twice a day Dx E11.9 300 each 3  . pioglitazone (ACTOS) 45 MG tablet TAKE 1 TABLET DAILY 90 tablet 3  . rosuvastatin (CRESTOR) 40 MG tablet Take 1 tablet (40 mg total) by mouth daily. 90 tablet 3  . colchicine 0.6 MG tablet Take 1 tablet (0.6 mg  total) by mouth 2 (two) times daily as needed. (Patient not taking: Reported on 12/15/2019) 60 tablet 1  . hydrocortisone (ANUSOL-HC) 25 MG suppository Place 1 suppository (25 mg total) rectally every 12 (twelve) hours. (Patient not taking: Reported on 12/15/2019) 12 suppository 1  . Multiple Vitamin (MULTIVITAMIN) tablet Take 1 tablet by mouth daily.     No current facility-administered medications for this encounter.    REVIEW OF SYSTEMS:  On review of systems, the patient reports that he is doing well overall. He denies any chest pain, shortness of breath, cough, fevers, chills, night sweats, unintended weight changes. He denies any bowel disturbances, and denies abdominal pain, nausea or vomiting. He denies any new musculoskeletal or joint aches or pains. His IPSS was 3, indicating mild urinary symptoms. He reports nocturia. His SHIM was 1, consistent with postsurgical erectile dysfunction. A complete review of systems is obtained and is otherwise negative.    PHYSICAL EXAM:  Wt Readings from Last 3 Encounters:  12/15/19 245 lb (111.1 kg)  09/11/19 256 lb (116.1 kg)  03/12/19 274 lb (124.3 kg)   Temp Readings from Last 3 Encounters:  09/11/19 98.3 F (36.8 C) (Oral)  03/12/19 98.8 F (37.1 C) (Oral)  09/10/18 98.9 F (37.2 C) (Oral)   BP Readings from Last 3 Encounters:  09/11/19 126/84    03/12/19 138/88  09/10/18 136/84   Pulse Readings from Last 3 Encounters:  09/11/19 82  03/12/19 87  09/10/18 84   Pain Assessment Pain Score: 0-No pain/10  In general this is a well appearing African American gentleman in no acute distress. He's alert and oriented x4 and appropriate throughout the examination. Cardiopulmonary assessment is negative for acute distress and he exhibits normal effort.    KPS = 90  100 - Normal; no complaints; no evidence of disease. 90   - Able to carry on normal activity; minor signs or symptoms of disease. 80   - Normal activity with effort; some signs or symptoms of disease. 60   - Cares for self; unable to carry on normal activity or to do active work. 60   - Requires occasional assistance, but is able to care for most of his personal needs. 50   - Requires considerable assistance and frequent medical care. 5   - Disabled; requires special care and assistance. 30   - Severely disabled; hospital admission is indicated although death not imminent. 22   - Very sick; hospital admission necessary; active supportive treatment necessary. 10   - Moribund; fatal processes progressing rapidly. 0     - Dead  Karnofsky DA, Abelmann [place], Craver LS and Burchenal JH [phone]) The use of the nitrogen mustards in the palliative treatment of carcinoma: with particular reference to bronchogenic carcinoma Cancer 1 634-56  LABORATORY DATA:  Lab Results  Component Value Date   WBC 2.8 (L) 09/11/2019   HGB 11.5 (L) 09/11/2019   HCT 35.6 (L) 09/11/2019   MCV 79.1 09/11/2019   PLT 235.0 09/11/2019   Lab Results  Component Value Date   NA 143 09/11/2019   K 4.7 09/11/2019   CL 103 09/11/2019   CO2 31 09/11/2019   Lab Results  Component Value Date   ALT 13 09/11/2019   AST 20 09/11/2019   ALKPHOS 39 09/11/2019   BILITOT 0.5 09/11/2019     RADIOGRAPHY: NM Bone Scan Whole Body  Result Date: 11/23/2019 CLINICAL DATA:  Prostate cancer.  Elevated PSA EXAM:  NUCLEAR MEDICINE WHOLE BODY BONE SCAN TECHNIQUE:  Whole body anterior and posterior images were obtained approximately 3 hours after intravenous injection of radiopharmaceutical. RADIOPHARMACEUTICALS:  19.8 mCi Technetium-83mMDP IV COMPARISON:  Bone scan 04/30/2008 FINDINGS: No foci of radiotracer accumulation within the axillary or appendicular skeleton to suggest metastasis. Uptake at the tip of the sternum is favored benign. Uptake in the LEFT mandible anteriorly is favored odontogenic in origin. IMPRESSION: 1. No evidence skeletal metastasis. 2. Focal uptake in the LEFT mandible is favor odontogenic. Electronically Signed   By: SSuzy BouchardM.D.   On: 11/23/2019 20:08      IMPRESSION/PLAN: This visit was conducted via MyChart to spare the patient unnecessary potential exposure in the healthcare setting during the current COVID-19 pandemic. 1. 69y.o. gentleman with rising, detectable PSA at 0.32 s/p RALP in 2009 for Stage pT2c, pN1, pMX Gleason 3+4 prostate cancer with pre-treatment PSA of 37.92. Today, we reviewed the findings and workup thus far.  We discussed the natural history of prostate cancer.  We reviewed the the implications of lymph node involvement, positive margins, extracapsular extension, and seminal vesicle involvement on the risk of prostate cancer recurrence, particularly in light of detectable, rising PSA postoperatively.  We discussed radiation treatment directed to the prostatic fossa with regard to the logistics and delivery of external beam radiation treatment. The recommendation is for a 7.5 week course of salvage radiotherapy to the prostate fossa concurrent with ST-ADT. We detailed the role of ADT in the treatment of biochemically recurrent prostate cancer and outlined the associated side effects that could be expected with this therapy.  He was encouraged to ask questions that were answered to his stated satisfaction.  At the conclusion of our discussion, the patient elects  to proceed with salvage external beam radiation therapy in combination with ADT. He has not had a recent Lupron injection. We will share our discussion with Dr. WLovena Neighboursand move forward with coordinating a follow up appointment for start of ADT, first available, prior to simulation. At his request, we will reach back out to him on Friday 12/18/19, and proceed with treatment planning in anticipation of beginning salvage radiation in the near future.  He appears to have a good understanding of his disease and our treatment recommendations which are of curative intent.  He is comfortable with and in agreement with the stated plan.  Given current concerns for patient exposure during the COVID-19 pandemic, this encounter was conducted via video-enabled MyChart visit. The patient has given verbal consent for this type of encounter. The time spent during this encounter was 50 minutes. The attendants for this meeting include MTyler PitaMD, Ashlyn Bruning PA-C, KCidra and patient RJEDIDIAH DEMARTINI During the encounter, MTyler PitaMD, Ashlyn Bruning PA-C, and scribe, KWilburn Mylarwere located at [facility].  Patient RTERRIAN SENTELLwas located at home.    ANicholos Johns PA-C    MTyler Pita MD  [facility] Direct Dial: [phone] Fax: [phone]  Skype  LinkedIn  This document serves as a record of services personally performed by MTyler Pita MD and AFreeman Caldron PA-C. It was created on their behalf by KWilburn Mylar a trained medical scribe. The creation of this record is based on the scribe's personal observations and the provider's statements to them. This document has been checked and approved by the attending provider.     References:  JAMA. 2006 Nov 15;296(19):2329-35.  Adjuvant radiotherapy for pathologically advanced prostate cancer: a  randomized clinical  trial.  [place] Silos IM Jr(1), Tangen CM, Hettie Holstein MS, Ova Freshwater, Messing Wayland Denis ED.  Author information: (1)Department of Urology, [facility] of [facility] [place], [place], [address] [place] [address], [place]. [email or web address]  CONTEXT: Despite a stage-shift to earlier cancer stages and lower tumor volumes for prostate cancer, pathologically advanced disease is detected at radical prostatectomy in 38% to 52% of patients. However, the optimal management of these patients after radical prostatectomy is unknown.  OBJECTIVE: To determine whether adjuvant radiotherapy improves metastasis-free survival in patients with stage pT3 N0 M0 prostate cancer.  DESIGN, SETTING, AND PATIENTS: Randomized, prospective, multi-institutional, [place] clinical trial with enrollment between July 25, 1987, and December 11, 1995 (with database frozen for statistical analysis on August 30, 2004). Patients were 67 men with pathologically advanced prostate cancer who had undergone radical prostatectomy. INTERVENTION: Men were randomly assigned to receive 60 to 64 Gy of external beam radiotherapy delivered to the prostatic fossa (n = 214) or usual care plus observation (n = 211).  MAIN OUTCOME MEASURES: Primary outcome was metastasis-free survival, defined as time to first occurrence of metastatic disease or death due to any cause. Secondary outcomes included prostate-specific antigen (PSA) relapse, recurrence-free survival, overall survival, freedom from hormonal therapy, and postoperative complications.  RESULTS: Among the 425 men, median follow-up was 10.6 years (interquartile range, 9.2-12.7 years). For metastasis-free survival, 76 (35.5%) of 214 men in the adjuvant radiotherapy group were diagnosed with metastatic disease or died (median metastasis-free estimate, 14.7 years), compared with 91 (43.1%) of 211 (median  metastasis-free estimate, 13.2 years) of those in the observation group (hazard ratio [HR], 0.75; 95% CI, 0.55-1.02; P = .06). There were no significant between-group differences for overall survival (71 deaths, median survival of 14.7 years for radiotherapy vs 83 deaths, median survival of 13.8 years for observation; HR, 0.80; 95% CI, 0.58-1.09; P = .16). PSA relapse (median PSA relapse-free survival, 10.3 years for radiotherapy vs 3.1 years for observation; HR, 0.43; 95% CI, 0.31-0.58; P<.001) and disease recurrence (median recurrence-free survival, 13.8 years for radiotherapy vs 9.9 years for observation; HR, 0.62; 95% CI, 0.46-0.82; P = .001) were both significantly reduced with radiotherapy. Adverse effects were more common with radiotherapy vs observation (23.8% vs 11.9%), including rectal complications (3.2% vs 0%), urethral strictures (17.8% vs 9.5%), and total urinary incontinence (6.5% vs 2.8%).  CONCLUSIONS: In men who had undergone radical prostatectomy for pathologically advanced prostate cancer, adjuvant radiotherapy resulted in significantly reduced risk of PSA relapse and disease recurrence, although the improvements in metastasis-free survival and overall survival were not statistically significant.  Trial Registration clinicaltrials.gov Identifier: [id number]. PMID: [id number]   J Clin Oncol. 2007 Jun 1;25(16):2225-9. Predominant treatment failure in postprostatectomy patients is local: analysis of patterns of treatment failure in SWOG 8794.  Swanson GP(1), [place] MA, Tangen CM, Chin J, Messing E, Canby-Hagino Daiva Eves, Doy Hutching ED;  Author information: (1)Department of Radiation Oncology and Urology, [facility] of [facility], [place] [address], [place]. [email or web address] Comment in [place]. 2007 Dec 10;25(35):5671-2.  PURPOSE: [facility] (SWOG) trial [phone] demonstrated that adjuvant radiation reduces the risk of biochemical  (prostate-specific antigen [PSA]) treatment failure by 50% over radical prostatectomy alone. In this analysis, we stratified patients as to their preradiation PSA levels and correlated it with outcomes such as PSA treatment failure, local recurrence, and distant failure, to serve as guidelines  for future research.  PATIENTS AND METHODS: Four hundred thirty-one subjects with pathologically advanced prostate cancer (extraprostatic extension, positive surgical margins, or seminal vesicle invasion) were randomly assigned to adjuvant radiotherapy or observation.  RESULTS: Three hundred seventy-four eligible patients had immediate postprostatectomy and follow-up PSA data. Median follow-up was 10.2 years. For patients with a postsurgical PSA of 0.2 ng/mL, radiation was associated with reductions in the 10-year risk of biochemical treatment failure (72% to 42%), local failures (20% to 7%), and distant failures (12% to 4%). For patients with a postsurgical PSA between higher than 0.2 and <or = 1.0 ng/mL, reductions in the 10-year risk of biochemical failure (80% to 73%), local failures (25% to 9%), and distant failures (16% to 12%) were realized. In patients with postsurgical PSA higher than 1.0, the respective findings were 94% versus 100%, 28% versus 9%, and 44% versus 18%.  CONCLUSION: The pattern of treatment failure in high-risk patients is predominantly local with a surprisingly low incidence of metastatic failure. Adjuvant radiation to the prostate bed reduces the risk of metastatic disease and biochemical failure at all postsurgical PSA levels. Further improvement in reducing local treatment failure is likely to have the greatest impact on outcome in high-risk patients after prostatectomy.  PMID: [id number]     J Urol. 2009 Mar;181(3):956-62.  Adjuvant radiotherapy for pathological T3N0M0 prostate cancer significantly reduces risk of metastases and improves survival: long-term followup of a randomized clinical  trial.  Grandville Silos IM(1), Tangen CM, Hettie Holstein MS, Ova Freshwater, Messing Wayland Denis ED.  Chief Strategy Officer information: (1)[employer] [facility] [place], [place], [place], [place]. PURPOSE: Extraprostatic disease will be manifest in a third of men after radical prostatectomy. We present the long-term followup of a randomized clinical trial of radiotherapy to reduce the risk of subsequent metastatic disease and death.  MATERIALS AND METHODS: A total of 431 men with pT3N0M0 prostate cancer were randomized to 60 to 64 Gy adjuvant radiotherapy or observation. The primary study end point was metastasis-free survival.  RESULTS: Of 425 eligible men 211 were randomized to observation and 214 to adjuvant radiation. Of those men under observation 70 ultimately received radiotherapy. Metastasis-free survival was significantly greater with radiotherapy (93 of 214 events on the radiotherapy arm vs 114 of 211 events on observation; HR 0.71; 95% CI 0.54, 0.94; p = 0.016). Survival improved significantly with adjuvant radiation (88 deaths of 214 on the radiotherapy arm vs 110 deaths of 211 on observation; HR 0.72; 95% CI 0.55, 0.96; p = 0.023).  CONCLUSIONS: Adjuvant radiotherapy after radical prostatectomy for a man with pT3N0M0 prostate cancer significantly reduces the risk of metastasis and increases survival.  PMCID: [id number] PMID: [id number]

## 2019-12-17 ENCOUNTER — Telehealth: Payer: Self-pay | Admitting: Medical Oncology

## 2019-12-17 NOTE — Telephone Encounter (Signed)
Spoke with Mr. Marcus Rivera to introduce myself as the prostate nurse navigator and discuss my role. I was unable to meet him 1/5 when he consulted with Dr. Tammi Klippel. He had prostatectomy in 2009 and has done well and how has a rising PSA. He had restaging studies done and they were negative.  He states he is well informed about treatment options and he's ready to move forward with ADT  and radiation. He is scheduled 1/12 for ADT with Dr. Lyndal Rainbow office and confirmed this appointment. He is aware that Enid Derry will be in contact, to schedule CT simulation. I gave him my contact information and asked him to call me with questions or concerns. He was very appreciative of my call.

## 2019-12-23 ENCOUNTER — Telehealth: Payer: Self-pay | Admitting: *Deleted

## 2019-12-23 NOTE — Telephone Encounter (Signed)
Called patient to inform of sim appt. for 12-25-19 - arrival time - 2:45 pm @ [facility], spoke with patient and he is aware of this appt.

## 2019-12-25 ENCOUNTER — Ambulatory Visit
Admission: RE | Admit: 2019-12-25 | Discharge: 2019-12-25 | Disposition: A | Discharge status: Home / Self Care | Payer: No Typology Code available for payment source | Source: Ambulatory Visit | Attending: Radiation Oncology | Admitting: Radiation Oncology

## 2019-12-25 ENCOUNTER — Other Ambulatory Visit: Payer: Self-pay

## 2019-12-25 DIAGNOSIS — C61 Malignant neoplasm of prostate: Secondary | ICD-10-CM | POA: Insufficient documentation

## 2019-12-25 DIAGNOSIS — Z510 Encounter for antineoplastic radiation therapy: Secondary | ICD-10-CM | POA: Diagnosis not present

## 2019-12-25 NOTE — Progress Notes (Signed)
  Radiation Oncology         (336) [phone] ________________________________  Name: Marcus Rivera MRN: [id number]  Date: 12/25/2019  DOB: 09-Jun-1951  SIMULATION AND TREATMENT PLANNING NOTE    ICD-10-CM   1. Malignant neoplasm of prostate ([place])  C61     DIAGNOSIS:   69 y.o. gentleman with rising, detectable PSA at 0.32 s/p RALP in 2009 for Stage pT2c, pN1, pMX Gleason 3+4 prostate cancer  NARRATIVE:  The patient was brought to the [facility].  Identity was confirmed.  All relevant records and images related to the planned course of therapy were reviewed.  The patient freely provided informed written consent to proceed with treatment after reviewing the details related to the planned course of therapy. The consent form was witnessed and verified by the simulation staff.  Then, the patient was set-up in a stable reproducible supine position for radiation therapy.  A vacuum lock pillow device was custom fabricated to position his legs in a reproducible immobilized position.  Then, I performed a urethrogram under sterile conditions to identify the prostatic bed.  CT images were obtained.  Surface markings were placed.  The CT images were loaded into the planning software.  Then the prostate bed target, pelvic lymph node target and avoidance structures including the rectum, bladder, bowel and hips were contoured.  Treatment planning then occurred.  The radiation prescription was entered and confirmed.  A total of one complex treatment devices were fabricated. I have requested : Intensity Modulated Radiotherapy (IMRT) is medically necessary for this case for the following reason:  Rectal sparing.[name]  PLAN:  The patient will receive 45 Gy in 25 fractions of 1.8 Gy, followed by a boost to the prostate bed to a total dose of 68.4 Gy with 13 additional fractions of 1.8 Gy.   ________________________________  Sheral Apley Tammi Klippel, M.D.

## 2019-12-28 ENCOUNTER — Encounter: Payer: Self-pay | Admitting: Medical Oncology

## 2019-12-31 DIAGNOSIS — Z510 Encounter for antineoplastic radiation therapy: Secondary | ICD-10-CM | POA: Diagnosis not present

## 2020-01-05 ENCOUNTER — Ambulatory Visit
Admission: RE | Admit: 2020-01-05 | Discharge: 2020-01-05 | Disposition: A | Discharge status: Home / Self Care | Payer: No Typology Code available for payment source | Source: Ambulatory Visit | Attending: Radiation Oncology | Admitting: Radiation Oncology

## 2020-01-05 ENCOUNTER — Other Ambulatory Visit: Payer: Self-pay

## 2020-01-05 DIAGNOSIS — Z510 Encounter for antineoplastic radiation therapy: Secondary | ICD-10-CM | POA: Diagnosis not present

## 2020-01-06 ENCOUNTER — Ambulatory Visit
Admission: RE | Admit: 2020-01-06 | Discharge: 2020-01-06 | Disposition: A | Discharge status: Home / Self Care | Payer: No Typology Code available for payment source | Source: Ambulatory Visit | Attending: Radiation Oncology | Admitting: Radiation Oncology

## 2020-01-06 ENCOUNTER — Other Ambulatory Visit: Payer: Self-pay

## 2020-01-06 DIAGNOSIS — Z510 Encounter for antineoplastic radiation therapy: Secondary | ICD-10-CM | POA: Diagnosis not present

## 2020-01-07 ENCOUNTER — Other Ambulatory Visit: Payer: Self-pay

## 2020-01-07 ENCOUNTER — Encounter: Payer: Self-pay | Admitting: Medical Oncology

## 2020-01-07 ENCOUNTER — Ambulatory Visit
Admission: RE | Admit: 2020-01-07 | Discharge: 2020-01-07 | Disposition: A | Discharge status: Home / Self Care | Payer: No Typology Code available for payment source | Source: Ambulatory Visit | Attending: Radiation Oncology | Admitting: Radiation Oncology

## 2020-01-07 DIAGNOSIS — Z510 Encounter for antineoplastic radiation therapy: Secondary | ICD-10-CM | POA: Diagnosis not present

## 2020-01-08 ENCOUNTER — Other Ambulatory Visit: Payer: Self-pay

## 2020-01-08 ENCOUNTER — Ambulatory Visit
Admission: RE | Admit: 2020-01-08 | Discharge: 2020-01-08 | Disposition: A | Discharge status: Home / Self Care | Payer: No Typology Code available for payment source | Source: Ambulatory Visit | Attending: Radiation Oncology | Admitting: Radiation Oncology

## 2020-01-08 DIAGNOSIS — Z510 Encounter for antineoplastic radiation therapy: Secondary | ICD-10-CM | POA: Diagnosis not present

## 2020-01-11 ENCOUNTER — Other Ambulatory Visit: Payer: Self-pay

## 2020-01-11 ENCOUNTER — Ambulatory Visit
Admission: RE | Admit: 2020-01-11 | Discharge: 2020-01-11 | Disposition: A | Discharge status: Home / Self Care | Payer: No Typology Code available for payment source | Source: Ambulatory Visit | Attending: Radiation Oncology | Admitting: Radiation Oncology

## 2020-01-11 DIAGNOSIS — C61 Malignant neoplasm of prostate: Secondary | ICD-10-CM | POA: Insufficient documentation

## 2020-01-12 ENCOUNTER — Other Ambulatory Visit: Payer: Self-pay

## 2020-01-12 ENCOUNTER — Ambulatory Visit
Admission: RE | Admit: 2020-01-12 | Discharge: 2020-01-12 | Disposition: A | Discharge status: Home / Self Care | Payer: No Typology Code available for payment source | Source: Ambulatory Visit | Attending: Radiation Oncology | Admitting: Radiation Oncology

## 2020-01-12 DIAGNOSIS — C61 Malignant neoplasm of prostate: Secondary | ICD-10-CM | POA: Diagnosis not present

## 2020-01-13 ENCOUNTER — Other Ambulatory Visit: Payer: Self-pay

## 2020-01-13 ENCOUNTER — Ambulatory Visit
Admission: RE | Admit: 2020-01-13 | Discharge: 2020-01-13 | Disposition: A | Discharge status: Home / Self Care | Payer: No Typology Code available for payment source | Source: Ambulatory Visit | Attending: Radiation Oncology | Admitting: Radiation Oncology

## 2020-01-13 DIAGNOSIS — C61 Malignant neoplasm of prostate: Secondary | ICD-10-CM | POA: Diagnosis not present

## 2020-01-14 ENCOUNTER — Ambulatory Visit
Admission: RE | Admit: 2020-01-14 | Discharge: 2020-01-14 | Disposition: A | Discharge status: Home / Self Care | Payer: No Typology Code available for payment source | Source: Ambulatory Visit | Attending: Radiation Oncology | Admitting: Radiation Oncology

## 2020-01-14 ENCOUNTER — Other Ambulatory Visit: Payer: Self-pay

## 2020-01-14 DIAGNOSIS — C61 Malignant neoplasm of prostate: Secondary | ICD-10-CM | POA: Diagnosis not present

## 2020-01-15 ENCOUNTER — Ambulatory Visit
Admission: RE | Admit: 2020-01-15 | Discharge: 2020-01-15 | Disposition: A | Discharge status: Home / Self Care | Payer: No Typology Code available for payment source | Source: Ambulatory Visit | Attending: Radiation Oncology | Admitting: Radiation Oncology

## 2020-01-15 ENCOUNTER — Other Ambulatory Visit: Payer: Self-pay

## 2020-01-15 DIAGNOSIS — C61 Malignant neoplasm of prostate: Secondary | ICD-10-CM | POA: Diagnosis not present

## 2020-01-18 ENCOUNTER — Other Ambulatory Visit: Payer: Self-pay

## 2020-01-18 ENCOUNTER — Ambulatory Visit
Admission: RE | Admit: 2020-01-18 | Discharge: 2020-01-18 | Disposition: A | Discharge status: Home / Self Care | Payer: No Typology Code available for payment source | Source: Ambulatory Visit | Attending: Radiation Oncology | Admitting: Radiation Oncology

## 2020-01-18 DIAGNOSIS — C61 Malignant neoplasm of prostate: Secondary | ICD-10-CM | POA: Diagnosis not present

## 2020-01-19 ENCOUNTER — Ambulatory Visit
Admission: RE | Admit: 2020-01-19 | Discharge: 2020-01-19 | Disposition: A | Discharge status: Home / Self Care | Payer: No Typology Code available for payment source | Source: Ambulatory Visit | Attending: Radiation Oncology | Admitting: Radiation Oncology

## 2020-01-19 ENCOUNTER — Other Ambulatory Visit: Payer: Self-pay

## 2020-01-19 DIAGNOSIS — C61 Malignant neoplasm of prostate: Secondary | ICD-10-CM | POA: Diagnosis not present

## 2020-01-20 ENCOUNTER — Other Ambulatory Visit: Payer: Self-pay

## 2020-01-20 ENCOUNTER — Ambulatory Visit
Admission: RE | Admit: 2020-01-20 | Discharge: 2020-01-20 | Disposition: A | Discharge status: Home / Self Care | Payer: No Typology Code available for payment source | Source: Ambulatory Visit | Attending: Radiation Oncology | Admitting: Radiation Oncology

## 2020-01-20 DIAGNOSIS — C61 Malignant neoplasm of prostate: Secondary | ICD-10-CM | POA: Diagnosis not present

## 2020-01-21 ENCOUNTER — Ambulatory Visit
Admission: RE | Admit: 2020-01-21 | Discharge: 2020-01-21 | Disposition: A | Discharge status: Home / Self Care | Payer: No Typology Code available for payment source | Source: Ambulatory Visit | Attending: Radiation Oncology | Admitting: Radiation Oncology

## 2020-01-21 ENCOUNTER — Other Ambulatory Visit: Payer: Self-pay

## 2020-01-21 DIAGNOSIS — C61 Malignant neoplasm of prostate: Secondary | ICD-10-CM | POA: Diagnosis not present

## 2020-01-22 ENCOUNTER — Ambulatory Visit
Admission: RE | Admit: 2020-01-22 | Discharge: 2020-01-22 | Disposition: A | Discharge status: Home / Self Care | Payer: No Typology Code available for payment source | Source: Ambulatory Visit | Attending: Radiation Oncology | Admitting: Radiation Oncology

## 2020-01-22 ENCOUNTER — Other Ambulatory Visit: Payer: Self-pay

## 2020-01-22 DIAGNOSIS — C61 Malignant neoplasm of prostate: Secondary | ICD-10-CM | POA: Diagnosis not present

## 2020-01-25 ENCOUNTER — Ambulatory Visit
Admission: RE | Admit: 2020-01-25 | Discharge: 2020-01-25 | Disposition: A | Discharge status: Home / Self Care | Payer: No Typology Code available for payment source | Source: Ambulatory Visit | Attending: Radiation Oncology | Admitting: Radiation Oncology

## 2020-01-25 ENCOUNTER — Other Ambulatory Visit: Payer: Self-pay

## 2020-01-25 DIAGNOSIS — C61 Malignant neoplasm of prostate: Secondary | ICD-10-CM | POA: Diagnosis not present

## 2020-01-26 ENCOUNTER — Ambulatory Visit
Admission: RE | Admit: 2020-01-26 | Discharge: 2020-01-26 | Disposition: A | Discharge status: Home / Self Care | Payer: No Typology Code available for payment source | Source: Ambulatory Visit | Attending: Radiation Oncology | Admitting: Radiation Oncology

## 2020-01-26 ENCOUNTER — Other Ambulatory Visit: Payer: Self-pay

## 2020-01-26 DIAGNOSIS — C61 Malignant neoplasm of prostate: Secondary | ICD-10-CM | POA: Diagnosis not present

## 2020-01-27 ENCOUNTER — Other Ambulatory Visit: Payer: Self-pay

## 2020-01-27 ENCOUNTER — Ambulatory Visit
Admission: RE | Admit: 2020-01-27 | Discharge: 2020-01-27 | Disposition: A | Discharge status: Home / Self Care | Payer: No Typology Code available for payment source | Source: Ambulatory Visit | Attending: Radiation Oncology | Admitting: Radiation Oncology

## 2020-01-27 DIAGNOSIS — C61 Malignant neoplasm of prostate: Secondary | ICD-10-CM | POA: Diagnosis not present

## 2020-01-28 ENCOUNTER — Other Ambulatory Visit: Payer: Self-pay

## 2020-01-28 ENCOUNTER — Ambulatory Visit
Admission: RE | Admit: 2020-01-28 | Discharge: 2020-01-28 | Disposition: A | Discharge status: Home / Self Care | Payer: No Typology Code available for payment source | Source: Ambulatory Visit | Attending: Radiation Oncology | Admitting: Radiation Oncology

## 2020-01-28 DIAGNOSIS — C61 Malignant neoplasm of prostate: Secondary | ICD-10-CM | POA: Diagnosis not present

## 2020-01-29 ENCOUNTER — Ambulatory Visit
Admission: RE | Admit: 2020-01-29 | Discharge: 2020-01-29 | Disposition: A | Discharge status: Home / Self Care | Payer: No Typology Code available for payment source | Source: Ambulatory Visit | Attending: Radiation Oncology | Admitting: Radiation Oncology

## 2020-01-29 ENCOUNTER — Other Ambulatory Visit: Payer: Self-pay

## 2020-01-29 DIAGNOSIS — C61 Malignant neoplasm of prostate: Secondary | ICD-10-CM | POA: Diagnosis not present

## 2020-02-01 ENCOUNTER — Ambulatory Visit
Admission: RE | Admit: 2020-02-01 | Discharge: 2020-02-01 | Disposition: A | Discharge status: Home / Self Care | Payer: No Typology Code available for payment source | Source: Ambulatory Visit | Attending: Radiation Oncology | Admitting: Radiation Oncology

## 2020-02-01 ENCOUNTER — Other Ambulatory Visit: Payer: Self-pay

## 2020-02-01 DIAGNOSIS — C61 Malignant neoplasm of prostate: Secondary | ICD-10-CM | POA: Diagnosis not present

## 2020-02-02 ENCOUNTER — Other Ambulatory Visit: Payer: Self-pay

## 2020-02-02 ENCOUNTER — Ambulatory Visit
Admission: RE | Admit: 2020-02-02 | Discharge: 2020-02-02 | Disposition: A | Discharge status: Home / Self Care | Payer: No Typology Code available for payment source | Source: Ambulatory Visit | Attending: Radiation Oncology | Admitting: Radiation Oncology

## 2020-02-02 DIAGNOSIS — C61 Malignant neoplasm of prostate: Secondary | ICD-10-CM | POA: Diagnosis not present

## 2020-02-03 ENCOUNTER — Ambulatory Visit
Admission: RE | Admit: 2020-02-03 | Discharge: 2020-02-03 | Disposition: A | Discharge status: Home / Self Care | Payer: No Typology Code available for payment source | Source: Ambulatory Visit | Attending: Radiation Oncology | Admitting: Radiation Oncology

## 2020-02-03 ENCOUNTER — Other Ambulatory Visit: Payer: Self-pay

## 2020-02-03 DIAGNOSIS — C61 Malignant neoplasm of prostate: Secondary | ICD-10-CM | POA: Diagnosis not present

## 2020-02-04 ENCOUNTER — Other Ambulatory Visit: Payer: Self-pay

## 2020-02-04 ENCOUNTER — Ambulatory Visit
Admission: RE | Admit: 2020-02-04 | Discharge: 2020-02-04 | Disposition: A | Discharge status: Home / Self Care | Payer: No Typology Code available for payment source | Source: Ambulatory Visit | Attending: Radiation Oncology | Admitting: Radiation Oncology

## 2020-02-04 DIAGNOSIS — C61 Malignant neoplasm of prostate: Secondary | ICD-10-CM | POA: Diagnosis not present

## 2020-02-05 ENCOUNTER — Other Ambulatory Visit: Payer: Self-pay

## 2020-02-05 ENCOUNTER — Ambulatory Visit
Admission: RE | Admit: 2020-02-05 | Discharge: 2020-02-05 | Disposition: A | Discharge status: Home / Self Care | Payer: No Typology Code available for payment source | Source: Ambulatory Visit | Attending: Radiation Oncology | Admitting: Radiation Oncology

## 2020-02-05 DIAGNOSIS — C61 Malignant neoplasm of prostate: Secondary | ICD-10-CM | POA: Diagnosis not present

## 2020-02-08 ENCOUNTER — Ambulatory Visit
Admission: RE | Admit: 2020-02-08 | Discharge: 2020-02-08 | Disposition: A | Discharge status: Home / Self Care | Payer: No Typology Code available for payment source | Source: Ambulatory Visit | Attending: Radiation Oncology | Admitting: Radiation Oncology

## 2020-02-08 ENCOUNTER — Other Ambulatory Visit: Payer: Self-pay

## 2020-02-08 DIAGNOSIS — C61 Malignant neoplasm of prostate: Secondary | ICD-10-CM | POA: Insufficient documentation

## 2020-02-08 DIAGNOSIS — Z510 Encounter for antineoplastic radiation therapy: Secondary | ICD-10-CM | POA: Insufficient documentation

## 2020-02-09 ENCOUNTER — Ambulatory Visit
Admission: RE | Admit: 2020-02-09 | Discharge: 2020-02-09 | Disposition: A | Discharge status: Home / Self Care | Payer: No Typology Code available for payment source | Source: Ambulatory Visit | Attending: Radiation Oncology | Admitting: Radiation Oncology

## 2020-02-09 ENCOUNTER — Encounter: Payer: Self-pay | Admitting: Medical Oncology

## 2020-02-09 ENCOUNTER — Other Ambulatory Visit: Payer: Self-pay

## 2020-02-09 DIAGNOSIS — Z510 Encounter for antineoplastic radiation therapy: Secondary | ICD-10-CM | POA: Diagnosis not present

## 2020-02-10 ENCOUNTER — Ambulatory Visit
Admission: RE | Admit: 2020-02-10 | Discharge: 2020-02-10 | Disposition: A | Discharge status: Home / Self Care | Payer: No Typology Code available for payment source | Source: Ambulatory Visit | Attending: Radiation Oncology | Admitting: Radiation Oncology

## 2020-02-10 DIAGNOSIS — Z510 Encounter for antineoplastic radiation therapy: Secondary | ICD-10-CM | POA: Diagnosis not present

## 2020-02-11 ENCOUNTER — Ambulatory Visit
Admission: RE | Admit: 2020-02-11 | Discharge: 2020-02-11 | Disposition: A | Discharge status: Home / Self Care | Payer: No Typology Code available for payment source | Source: Ambulatory Visit | Attending: Radiation Oncology | Admitting: Radiation Oncology

## 2020-02-11 ENCOUNTER — Other Ambulatory Visit: Payer: Self-pay

## 2020-02-11 DIAGNOSIS — Z510 Encounter for antineoplastic radiation therapy: Secondary | ICD-10-CM | POA: Diagnosis not present

## 2020-02-12 ENCOUNTER — Ambulatory Visit
Admission: RE | Admit: 2020-02-12 | Discharge: 2020-02-12 | Disposition: A | Discharge status: Home / Self Care | Payer: No Typology Code available for payment source | Source: Ambulatory Visit | Attending: Radiation Oncology | Admitting: Radiation Oncology

## 2020-02-12 ENCOUNTER — Other Ambulatory Visit: Payer: Self-pay

## 2020-02-12 DIAGNOSIS — Z510 Encounter for antineoplastic radiation therapy: Secondary | ICD-10-CM | POA: Diagnosis not present

## 2020-02-15 ENCOUNTER — Other Ambulatory Visit: Payer: Self-pay

## 2020-02-15 ENCOUNTER — Ambulatory Visit
Admission: RE | Admit: 2020-02-15 | Discharge: 2020-02-15 | Disposition: A | Discharge status: Home / Self Care | Payer: No Typology Code available for payment source | Source: Ambulatory Visit | Attending: Radiation Oncology | Admitting: Radiation Oncology

## 2020-02-15 DIAGNOSIS — Z510 Encounter for antineoplastic radiation therapy: Secondary | ICD-10-CM | POA: Diagnosis not present

## 2020-02-16 ENCOUNTER — Other Ambulatory Visit: Payer: Self-pay

## 2020-02-16 ENCOUNTER — Ambulatory Visit
Admission: RE | Admit: 2020-02-16 | Discharge: 2020-02-16 | Disposition: A | Discharge status: Home / Self Care | Payer: No Typology Code available for payment source | Source: Ambulatory Visit | Attending: Radiation Oncology | Admitting: Radiation Oncology

## 2020-02-16 DIAGNOSIS — Z510 Encounter for antineoplastic radiation therapy: Secondary | ICD-10-CM | POA: Diagnosis not present

## 2020-02-17 ENCOUNTER — Other Ambulatory Visit: Payer: Self-pay

## 2020-02-17 ENCOUNTER — Ambulatory Visit
Admission: RE | Admit: 2020-02-17 | Discharge: 2020-02-17 | Disposition: A | Discharge status: Home / Self Care | Payer: No Typology Code available for payment source | Source: Ambulatory Visit | Attending: Radiation Oncology | Admitting: Radiation Oncology

## 2020-02-17 DIAGNOSIS — Z510 Encounter for antineoplastic radiation therapy: Secondary | ICD-10-CM | POA: Diagnosis not present

## 2020-02-18 ENCOUNTER — Other Ambulatory Visit: Payer: Self-pay

## 2020-02-18 ENCOUNTER — Ambulatory Visit
Admission: RE | Admit: 2020-02-18 | Discharge: 2020-02-18 | Disposition: A | Discharge status: Home / Self Care | Payer: No Typology Code available for payment source | Source: Ambulatory Visit | Attending: Radiation Oncology | Admitting: Radiation Oncology

## 2020-02-18 DIAGNOSIS — Z510 Encounter for antineoplastic radiation therapy: Secondary | ICD-10-CM | POA: Diagnosis not present

## 2020-02-19 ENCOUNTER — Ambulatory Visit
Admission: RE | Admit: 2020-02-19 | Discharge: 2020-02-19 | Disposition: A | Discharge status: Home / Self Care | Payer: No Typology Code available for payment source | Source: Ambulatory Visit | Attending: Radiation Oncology | Admitting: Radiation Oncology

## 2020-02-19 ENCOUNTER — Other Ambulatory Visit: Payer: Self-pay

## 2020-02-19 DIAGNOSIS — Z510 Encounter for antineoplastic radiation therapy: Secondary | ICD-10-CM | POA: Diagnosis not present

## 2020-02-22 ENCOUNTER — Ambulatory Visit
Admission: RE | Admit: 2020-02-22 | Discharge: 2020-02-22 | Disposition: A | Discharge status: Home / Self Care | Payer: No Typology Code available for payment source | Source: Ambulatory Visit | Attending: Radiation Oncology | Admitting: Radiation Oncology

## 2020-02-22 ENCOUNTER — Other Ambulatory Visit: Payer: Self-pay

## 2020-02-22 DIAGNOSIS — Z510 Encounter for antineoplastic radiation therapy: Secondary | ICD-10-CM | POA: Diagnosis not present

## 2020-02-23 ENCOUNTER — Other Ambulatory Visit: Payer: Self-pay

## 2020-02-23 ENCOUNTER — Ambulatory Visit
Admission: RE | Admit: 2020-02-23 | Discharge: 2020-02-23 | Disposition: A | Discharge status: Home / Self Care | Payer: No Typology Code available for payment source | Source: Ambulatory Visit | Attending: Radiation Oncology | Admitting: Radiation Oncology

## 2020-02-23 DIAGNOSIS — Z510 Encounter for antineoplastic radiation therapy: Secondary | ICD-10-CM | POA: Diagnosis not present

## 2020-02-24 ENCOUNTER — Other Ambulatory Visit: Payer: Self-pay

## 2020-02-24 ENCOUNTER — Ambulatory Visit
Admission: RE | Admit: 2020-02-24 | Discharge: 2020-02-24 | Disposition: A | Discharge status: Home / Self Care | Payer: No Typology Code available for payment source | Source: Ambulatory Visit | Attending: Radiation Oncology | Admitting: Radiation Oncology

## 2020-02-24 DIAGNOSIS — Z510 Encounter for antineoplastic radiation therapy: Secondary | ICD-10-CM | POA: Diagnosis not present

## 2020-02-25 ENCOUNTER — Ambulatory Visit
Admission: RE | Admit: 2020-02-25 | Discharge: 2020-02-25 | Disposition: A | Discharge status: Home / Self Care | Payer: No Typology Code available for payment source | Source: Ambulatory Visit | Attending: Radiation Oncology | Admitting: Radiation Oncology

## 2020-02-25 ENCOUNTER — Encounter: Payer: Self-pay | Admitting: Medical Oncology

## 2020-02-25 ENCOUNTER — Encounter: Payer: Self-pay | Admitting: Radiation Oncology

## 2020-02-25 ENCOUNTER — Other Ambulatory Visit: Payer: Self-pay

## 2020-02-25 DIAGNOSIS — Z510 Encounter for antineoplastic radiation therapy: Secondary | ICD-10-CM | POA: Diagnosis not present

## 2020-03-11 ENCOUNTER — Ambulatory Visit: Payer: BC Managed Care – PPO | Admitting: Internal Medicine

## 2020-03-17 ENCOUNTER — Other Ambulatory Visit: Payer: Self-pay

## 2020-03-17 ENCOUNTER — Ambulatory Visit (INDEPENDENT_AMBULATORY_CARE_PROVIDER_SITE_OTHER): Payer: 59 | Admitting: Internal Medicine

## 2020-03-17 ENCOUNTER — Encounter: Payer: Self-pay | Admitting: Internal Medicine

## 2020-03-17 VITALS — BP 138/80 | HR 66 | Temp 98.0°F | Ht 67.0 in | Wt 271.2 lb

## 2020-03-17 DIAGNOSIS — Z0000 Encounter for general adult medical examination without abnormal findings: Secondary | ICD-10-CM | POA: Diagnosis not present

## 2020-03-17 DIAGNOSIS — E119 Type 2 diabetes mellitus without complications: Secondary | ICD-10-CM | POA: Diagnosis not present

## 2020-03-17 LAB — LIPID PANEL
Cholesterol: 180 mg/dL (ref 0–200)
HDL: 98.6 mg/dL (ref >39.00)
LDL Cholesterol: 69 mg/dL (ref 0–99)
NonHDL: 81.85
Total CHOL/HDL Ratio: 2
Triglycerides: 62 mg/dL (ref 0.0–149.0)
VLDL: 12.4 mg/dL (ref 0.0–40.0)

## 2020-03-17 LAB — BASIC METABOLIC PANEL
BUN: 18 mg/dL (ref 6–23)
CO2: 31 mEq/L (ref 19–32)
Calcium: 10 mg/dL (ref 8.4–10.5)
Chloride: 104 mEq/L (ref 96–112)
Creatinine, Ser: 1.2 mg/dL (ref 0.40–1.50)
GFR: 72.82 mL/min (ref >60.00)
Glucose, Bld: 112 mg/dL — ABNORMAL HIGH (ref 70–99)
Potassium: 4.9 mEq/L (ref 3.5–5.1)
Sodium: 142 mEq/L (ref 135–145)

## 2020-03-17 LAB — HEPATIC FUNCTION PANEL
ALT: 16 U/L (ref 0–53)
AST: 18 U/L (ref 0–37)
Albumin: 4.2 g/dL (ref 3.5–5.2)
Alkaline Phosphatase: 42 U/L (ref 39–117)
Bilirubin, Direct: 0.1 mg/dL (ref 0.0–0.3)
Total Bilirubin: 0.4 mg/dL (ref 0.2–1.2)
Total Protein: 7.1 g/dL (ref 6.0–8.3)

## 2020-03-17 LAB — CBC WITH DIFFERENTIAL/PLATELET
Basophils Absolute: 0 10*3/uL (ref 0.0–0.1)
Basophils Relative: 0.6 % (ref 0.0–3.0)
Eosinophils Absolute: 0.1 10*3/uL (ref 0.0–0.7)
Eosinophils Relative: 2.6 % (ref 0.0–5.0)
HCT: 32.5 % — ABNORMAL LOW (ref 39.0–52.0)
Hemoglobin: 10.5 g/dL — ABNORMAL LOW (ref 13.0–17.0)
Lymphocytes Relative: 13.9 % (ref 12.0–46.0)
Lymphs Abs: 0.3 10*3/uL — ABNORMAL LOW (ref 0.7–4.0)
MCHC: 32.4 g/dL (ref 30.0–36.0)
MCV: 79.3 fl (ref 78.0–100.0)
Monocytes Absolute: 0.4 10*3/uL (ref 0.1–1.0)
Monocytes Relative: 16.6 % — ABNORMAL HIGH (ref 3.0–12.0)
Neutro Abs: 1.6 10*3/uL (ref 1.4–7.7)
Neutrophils Relative %: 66.3 % (ref 43.0–77.0)
Platelets: 211 10*3/uL (ref 150.0–400.0)
RBC: 4.1 Mil/uL — ABNORMAL LOW (ref 4.22–5.81)
RDW: 16.7 % — ABNORMAL HIGH (ref 11.5–15.5)
WBC: 2.5 10*3/uL — ABNORMAL LOW (ref 4.0–10.5)

## 2020-03-17 LAB — TSH: TSH: 2.23 u[IU]/mL (ref 0.35–4.50)

## 2020-03-17 LAB — URINALYSIS, ROUTINE W REFLEX MICROSCOPIC
Bilirubin Urine: NEGATIVE
Hgb urine dipstick: NEGATIVE
Ketones, ur: NEGATIVE
Leukocytes,Ua: NEGATIVE
Nitrite: NEGATIVE
RBC / HPF: NONE SEEN (ref 0–?)
Specific Gravity, Urine: 1.02 (ref 1.000–1.030)
Total Protein, Urine: NEGATIVE
Urine Glucose: NEGATIVE
Urobilinogen, UA: 0.2 (ref 0.0–1.0)
WBC, UA: NONE SEEN (ref 0–?)
pH: 6 (ref 5.0–8.0)

## 2020-03-17 LAB — MICROALBUMIN / CREATININE URINE RATIO
Creatinine,U: 103 mg/dL
Microalb Creat Ratio: 1.4 mg/g (ref 0.0–30.0)
Microalb, Ur: 1.4 mg/dL (ref 0.0–1.9)

## 2020-03-17 LAB — HEMOGLOBIN A1C: Hgb A1c MFr Bld: 7 % — ABNORMAL HIGH (ref 4.6–6.5)

## 2020-03-17 MED ORDER — HYDROCORTISONE ACETATE 25 MG RE SUPP: 25.0000 mg | Freq: Two times a day (BID) | RECTAL | 1 refills | Status: AC

## 2020-03-17 NOTE — Patient Instructions (Signed)
Please continue all other medications as before, and refills have been done if requested - the anusol [facility] suppos's  Please have the pharmacy call with any other refills you may need.  Please continue your efforts at being more active, low cholesterol diet, and weight control.  You are otherwise up to date with prevention measures today.  Please keep your appointments with your specialists as you may have planned  Please go to the LAB at the blood drawing area for the tests to be done  You will be contacted by phone if any changes need to be made immediately.  Otherwise, you will receive a letter about your results with an explanation, but please check with MyChart first.  Please remember to sign up for MyChart if you have not done so, as this will be important to you in the future with finding out test results, communicating by private email, and scheduling acute appointments online when needed.  Please make an Appointment to return in 6 months, or sooner if needed, also with Lab Appointment for testing done 3-5 days before at the [place] (so this is for TWO appointments - please see the scheduling desk as you leave)

## 2020-03-17 NOTE — Assessment & Plan Note (Signed)
stable overall by history and exam, recent data reviewed with pt, and pt to continue medical treatment as before,  to f/u any worsening symptoms or concerns  

## 2020-03-17 NOTE — Assessment & Plan Note (Signed)

## 2020-03-17 NOTE — Progress Notes (Signed)
Subjective:    Patient ID: Marcus Rivera, male    DOB: [birth year], 69 y.o.   MRN: [id number]  HPI  Here for wellness and f/u;  Overall doing ok;  Pt denies Chest pain, worsening SOB, DOE, wheezing, orthopnea, PND, worsening LE edema, palpitations, dizziness or syncope.  Pt denies neurological change such as new headache, facial or extremity weakness.  Pt denies polydipsia, polyuria, or low sugar symptoms. Pt states overall good compliance with treatment and medications, good tolerability, and has been trying to follow appropriate diet.  Pt denies worsening depressive symptoms, suicidal ideation or panic. No fever, night sweats, wt loss, loss of appetite, or other constitutional symptoms.  Pt states good ability with ADL's, has low fall risk, home safety reviewed and adequate, no other significant changes in hearing or vision, and only occasionally active with exercise.  No new complaints Past Medical History:  Diagnosis Date  . ANEMIA-NOS 10/03/2010  . DIABETES MELLITUS, TYPE II 04/12/2008  . GLUCOSE INTOLERANCE 03/12/2008  . Gout   . HYPERLIPIDEMIA 04/12/2008  . HYPERTENSION 03/12/2008  . Prostate cancer ([place])   . PROSTATE CANCER, HX OF 09/05/2009  . PSA, INCREASED 03/18/2008   Past Surgical History:  Procedure Laterality Date  . COLONOSCOPY    . COLONOSCOPY N/A 11/29/2014   Procedure: COLONOSCOPY;  Surgeon: Inda Castle, MD;  Location: [facility];  Service: Endoscopy;  Laterality: N/A;  . negative stress echo  Oct. 17, 2011   [facility]  . PROSTATE SURGERY  2009    reports that he quit smoking about 31 years ago. His smoking use included cigarettes. He has a 16.00 pack-year smoking history. He has never used smokeless tobacco. He reports that he does not drink alcohol or use drugs. family history includes Breast cancer in his mother; Cancer in his mother; Colon cancer in his paternal aunt; Diabetes in an other family member; Hypertension in an other family member;  Liver disease in his mother; Lung cancer in his mother; Prostate cancer in his father. No Known Allergies Current Outpatient Medications on File Prior to Visit  Medication Sig Dispense Refill  . ascorbic acid (VITAMIN C) 1000 MG tablet Take by mouth.    [name] aspirin 81 MG EC tablet Take 81 mg by mouth daily. Reports taking this medication approximately twice per week.    . Calcium-Vitamin D 500-125 MG-UNIT TABS Take 1 tablet by mouth daily.     . colchicine 0.6 MG tablet Take 1 tablet (0.6 mg total) by mouth 2 (two) times daily as needed. 60 tablet 1  . ibuprofen (ADVIL) 800 MG tablet Take 800 mg by mouth every 6 (six) hours as needed.    . Magnesium 400 MG CAPS Take by mouth.    . metFORMIN (GLUCOPHAGE) 1000 MG tablet Take 1 tablet (1,000 mg total) by mouth 2 (two) times daily with a meal. 180 tablet 3  . Multiple Vitamin (MULTIVITAMIN) tablet Take 1 tablet by mouth daily.    . Olmesartan-amLODIPine-HCTZ 40-10-25 MG TABS Take 1 tablet by mouth every morning. 90 tablet 3  . ONETOUCH DELICA LANCETS 99991111 MISC Use to help check blood sugars twice a day Dx E11.9 300 each 3  . pioglitazone (ACTOS) 45 MG tablet TAKE 1 TABLET DAILY 90 tablet 3  . rosuvastatin (CRESTOR) 40 MG tablet Take 1 tablet (40 mg total) by mouth daily. 90 tablet 3   No current facility-administered medications on file prior to visit.   Review of Systems All otherwise neg per  pt     Objective:   Physical Exam BP 138/80   Pulse 66   Temp 98 F (36.7 C)   Ht 5\' 7"  (1.702 m)   Wt 271 lb 3.2 oz (123 kg)   SpO2 98%   BMI 42.48 kg/m  VS noted,  Constitutional: Pt appears in NAD HENT: Head: NCAT.  Right Ear: External ear normal.  Left Ear: External ear normal.  Eyes: . Pupils are equal, round, and reactive to light. Conjunctivae and EOM are normal Nose: without d/c or deformity Neck: Neck supple. Gross normal ROM Cardiovascular: Normal rate and regular rhythm.   Pulmonary/Chest: Effort normal and breath sounds without  rales or wheezing.  Abd:  Soft, NT, ND, + BS, no organomegaly Neurological: Pt is alert. At baseline orientation, motor grossly intact Skin: Skin is warm. No rashes, other new lesions, no LE edema Psychiatric: Pt behavior is normal without agitation  All otherwise neg per pt  Lab Results  Component Value Date   WBC 2.5 (L) 03/17/2020   HGB 10.5 (L) 03/17/2020   HCT 32.5 (L) 03/17/2020   PLT 211.0 03/17/2020   GLUCOSE 112 (H) 03/17/2020   CHOL 180 03/17/2020   TRIG 62.0 03/17/2020   HDL 98.60 03/17/2020   LDLDIRECT 139.7 09/05/2009   LDLCALC 69 03/17/2020   ALT 16 03/17/2020   AST 18 03/17/2020   NA 142 03/17/2020   K 4.9 03/17/2020   CL 104 03/17/2020   CREATININE 1.20 03/17/2020   BUN 18 03/17/2020   CO2 31 03/17/2020   TSH 2.23 03/17/2020   PSA 2.20 09/06/2017   HGBA1C 7.0 (H) 03/17/2020   MICROALBUR 1.4 03/17/2020      Assessment & Plan:

## 2020-03-23 NOTE — Progress Notes (Signed)
  Radiation Oncology         [phone]) [phone] ________________________________  Name: Marcus Rivera MRN: [id number]  Date: 02/25/2020  DOB: May 23, 1951  End of Treatment Note  Diagnosis:   69 y.o. gentleman with rising, detectable PSA at 0.32 s/p RALP in 2009 for Stage pT2c, pN1, pMX Gleason 3+4 prostate cancer with pre-treatment PSA of 37.92.     Indication for treatment:  Curative, Definitive Radiotherapy       Radiation treatment dates:   1/26-3/18/21  Site/dose:  1. The prostate fossa and pelvic lymph nodes were initially treated to 45 Gy in 25 fractions of 1.8 Gy  2. The prostate fossa only was boosted to 68.4 Gy with 13 additional fractions of 1.8 Gy   Beams/energy:  1. The prostate fossa  and pelvic lymph nodes were initially treated using VMAT intensity modulated radiotherapy delivering 6 megavolt photons. Image guidance was performed with CB-CT studies prior to each fraction. He was immobilized with a body fix lower extremity mold.  2. The prostate fossa only was boosted using VMAT intensity modulated radiotherapy delivering 6 megavolt photons. Image guidance was performed with CB-CT studies prior to each fraction. He was immobilized with a body fix lower extremity mold.  Narrative: The patient tolerated radiation treatment relatively well.   The patient experienced some minor urinary irritation and modest fatigue.    Plan: The patient has completed radiation treatment. He will return to radiation oncology clinic for routine followup in one month. I advised him to call or return sooner if he has any questions or concerns related to his recovery or treatment. ________________________________  Sheral Apley. Tammi Klippel, M.D.

## 2020-03-29 ENCOUNTER — Telehealth: Payer: Self-pay

## 2020-03-29 NOTE — Telephone Encounter (Signed)
Appointment reminder for 03/30/20 patient verbalized he understood this is a telephone encounter

## 2020-03-30 ENCOUNTER — Ambulatory Visit
Admission: RE | Admit: 2020-03-30 | Discharge: 2020-03-30 | Disposition: A | Discharge status: Home / Self Care | Payer: 59 | Source: Ambulatory Visit | Attending: Urology | Admitting: Urology

## 2020-03-30 ENCOUNTER — Encounter: Payer: Self-pay | Admitting: Urology

## 2020-03-30 ENCOUNTER — Other Ambulatory Visit: Payer: Self-pay

## 2020-03-30 DIAGNOSIS — C61 Malignant neoplasm of prostate: Secondary | ICD-10-CM

## 2020-03-30 NOTE — Progress Notes (Signed)
Radiation Oncology         [phone]) [phone] ________________________________  Name: Marcus Rivera MRN: [id number]  Date: 03/30/2020  DOB: 07-Aug-1951  Post Treatment Note  CC: [name], MD  [name], MD  Diagnosis:   69 y.o. gentleman with rising, detectable PSA at 0.32 s/p RALP in 2009 for Stage pT2c, pN1, pMX Gleason 3+4 prostate cancer with pre-treatment PSA of 37.92.     Interval Since Last Radiation:  5 weeks (concurrent with ADT- started 12/22/19) 1/26-3/18/21: 1. The prostate fossa and pelvic lymph nodes were initially treated to 45 Gy in 25 fractions of 1.8 Gy  2. The prostate fossa only was boosted to 68.4 Gy with 13 additional fractions of 1.8 Gy    Narrative:  I spoke with the patient to conduct his/her routine scheduled 1 month follow up visit via telephone to spare the patient unnecessary potential exposure in the healthcare setting during the current COVID-19 pandemic. The patient was notified in advance and gave permission to proceed with this visit format. He tolerated radiation treatment relatively well.   The patient experienced some minor urinary irritation with increased frequency and urgency as well as increased frequency with soft bowel movements but denied dysuria, gross hematuria, incontinence or incomplete bladder emptying.  He did experience modest fatigue and occasional hot flashes associated with his ADT but felt that he was tolerating the treatment well overall.                              On review of systems, the patient states that he is doing very well overall.  He has noticed gradual improvement in his LUTS.  His current IPSS is 16, indicating moderate LUTS with weak flow of stream, nocturia 3x/night and feelings of incomplete emptying. He denies straining to void, dysuria, gross hematuria, fever or chills.  The loose stools have resolved and he is pretty much back to his baseline regarding his bowel habits at this point.  He has also noticed  some gradual improvement in his energy level and overall feels that he continues to tolerate the ADT fairly well despite the hot flashes at night.  In general, he is quite pleased with his progress to date.  ALLERGIES:  has No Known Allergies.  Meds: Current Outpatient Medications  Medication Sig Dispense Refill  . ascorbic acid (VITAMIN C) 1000 MG tablet Take by mouth.    [name] aspirin 81 MG EC tablet Take 81 mg by mouth daily. Reports taking this medication approximately twice per week.    . Calcium-Vitamin D 500-125 MG-UNIT TABS Take 1 tablet by mouth daily.     . colchicine 0.6 MG tablet Take 1 tablet (0.6 mg total) by mouth 2 (two) times daily as needed. 60 tablet 1  . hydrocortisone (ANUSOL-HC) 25 MG suppository Place 1 suppository (25 mg total) rectally every 12 (twelve) hours. 12 suppository 1  . ibuprofen (ADVIL) 800 MG tablet Take 800 mg by mouth every 6 (six) hours as needed.    . Magnesium 400 MG CAPS Take by mouth.    . metFORMIN (GLUCOPHAGE) 1000 MG tablet Take 1 tablet (1,000 mg total) by mouth 2 (two) times daily with a meal. 180 tablet 3  . Multiple Vitamin (MULTIVITAMIN) tablet Take 1 tablet by mouth daily.    . Olmesartan-amLODIPine-HCTZ 40-10-25 MG TABS Take 1 tablet by mouth every morning. 90 tablet 3  . ONETOUCH DELICA LANCETS 99991111 MISC Use to  help check blood sugars twice a day Dx E11.9 300 each 3  . pioglitazone (ACTOS) 45 MG tablet TAKE 1 TABLET DAILY 90 tablet 3  . rosuvastatin (CRESTOR) 40 MG tablet Take 1 tablet (40 mg total) by mouth daily. 90 tablet 3   No current facility-administered medications for this encounter.    Physical Findings:  vitals were not taken for this visit.   /Unable to assess due to telephone follow up visit format.  Lab Findings: Lab Results  Component Value Date   WBC 2.5 (L) 03/17/2020   HGB 10.5 (L) 03/17/2020   HCT 32.5 (L) 03/17/2020   MCV 79.3 03/17/2020   PLT 211.0 03/17/2020     Radiographic Findings: No results  found.  Impression/Plan: 1. 69 y.o. gentleman with rising, detectable PSA at 0.32 s/p RALP in 2009 for Stage pT2c, pN1, pMX Gleason 3+4 prostate cancer with pre-treatment PSA of 37.92.     He will continue to follow up with urology for ongoing PSA determinations and had recent follow-up appointment with Dr. Lovena Neighbours on 03/29/2020 where he was advised that his PSA had decreased to 0.02 which he is quite pleased with.  His next scheduled follow-up with Dr. Lovena Neighbours will be in 3 months for his next ADT injection and he will have PSA lab work drawn prior to that appointment.  He understands what to expect with regards to PSA monitoring going forward. I will look forward to following his response to treatment via correspondence with urology, and would be happy to continue to participate in his care if clinically indicated. I talked to the patient about what to expect in the future, including his risk for erectile dysfunction and rectal bleeding. I encouraged him to call or return to the office if he has any questions regarding his previous radiation or possible radiation side effects. He was comfortable with this plan and will follow up as needed.  Today, a comprehensive survivorship care plan and treatment summary was reviewed with the patient today detailing his prostate cancer diagnosis, treatment course, potential late/long-term effects of treatment, appropriate follow-up care with recommendations for the future, and patient education resources.  A copy of this summary, along with a letter will be sent to the patient's primary care provider via mail/fax/In Basket message after today's visit.   2. Cancer screening:  Due to Mr. Zywicki's history and his age, he should receive screening for skin cancers, colon cancer, and lung cancer.  The information and recommendations are listed on the patient's comprehensive care plan/treatment summary and were reviewed in detail with the patient.     3. Health maintenance and  wellness promotion: Mr. Welcome was encouraged to consume 5-7 servings of fruits and vegetables per day. He was provided a copy of the "Nutrition Rainbow" handout, as well as the handout "Take Control of Your Health and [place]" from the [facility].  He was also encouraged to engage in moderate to vigorous exercise for 30 minutes per day most days of the week. Information was provided regarding the [facility] fitness program, which is designed for cancer survivors to help them become more physically fit after cancer treatments. We discussed that a healthy BMI is 18.5-24.9 and that maintaining a healthy weight reduces risk of cancer recurrences.  He was instructed to limit his alcohol consumption and continue to abstain from tobacco use.  Lastly, he was encouraged to use sunscreen and wear protective clothing when in the sun.     4. Support  services/counseling: It is not uncommon for this period of the patient's cancer care trajectory to be one of many emotions and stressors.  Mr. Hladky was encouraged to take advantage of our many support services programs, support groups, and/or counseling in coping with his new life as a cancer survivor after completing anti-cancer treatment.  He was offered support today through active listening and expressive supportive counseling.  He was given information regarding our available services and encouraged to contact me with any questions or for help enrolling in any of our support group/programs.      Nicholos Johns, PA-C

## 2020-04-29 ENCOUNTER — Other Ambulatory Visit: Payer: Self-pay | Admitting: Internal Medicine

## 2020-04-29 MED ORDER — PIOGLITAZONE HCL 45 MG PO TABS: ORAL_TABLET | ORAL | 2 refills | Status: DC

## 2020-04-29 MED ORDER — METFORMIN HCL 1000 MG PO TABS: 1000.0000 mg | ORAL_TABLET | Freq: Two times a day (BID) | ORAL | 2 refills | Status: DC

## 2020-04-29 MED ORDER — OLMESARTAN-AMLODIPINE-HCTZ 40-10-25 MG PO TABS: 1.0000 | ORAL_TABLET | ORAL | 2 refills | Status: DC

## 2020-04-29 MED ORDER — ROSUVASTATIN CALCIUM 40 MG PO TABS: 40.0000 mg | ORAL_TABLET | Freq: Every day | ORAL | 2 refills | Status: DC

## 2020-04-29 NOTE — Telephone Encounter (Signed)
Reviewed chart pt is up-to-date sent refills to [facility].../lmb  

## 2020-04-29 NOTE — Telephone Encounter (Signed)
New message:   1.Medication Requested: metFORMIN (GLUCOPHAGE) 1000 MG tablet rosuvastatin (CRESTOR) 40 MG tablet pioglitazone (ACTOS) 45 MG tablet Olmesartan-amLODIPine-HCTZ 40-10-25 MG TABS 2. Pharmacy (Name, Street, [place]): [id number] - [place], [address] 3. On Med List: yes  4. Last Visit with PCP: 03/17/20  5. Next visit date with PCP: 09/16/20  Pt states they were denied by pharmacy. Please call the pt when prescriptions has been sent. Agent: Please be advised that RX refills may take up to 3 business days. We ask that you follow-up with your pharmacy.

## 2020-05-28 IMAGING — NM NM BONE WHOLE BODY
2 series · 2 of 2 positions shown · non-contrast
Comparison: Bone scan 04/30/2008

CLINICAL DATA: Prostate cancer.  Elevated PSA

EXAM:
NUCLEAR MEDICINE WHOLE BODY BONE SCAN
TECHNIQUE: Whole body anterior and posterior images were obtained approximately
3 hours after intravenous injection of radiopharmaceutical.
RADIOPHARMACEUTICALS:  19.8 mCi Vechnetium-44m MDP IV

[Series 1: wbr_bone_40 whole body · 2.66mm/px · 1 of 1 slices shown (1 of 2)]
[im 1/1]
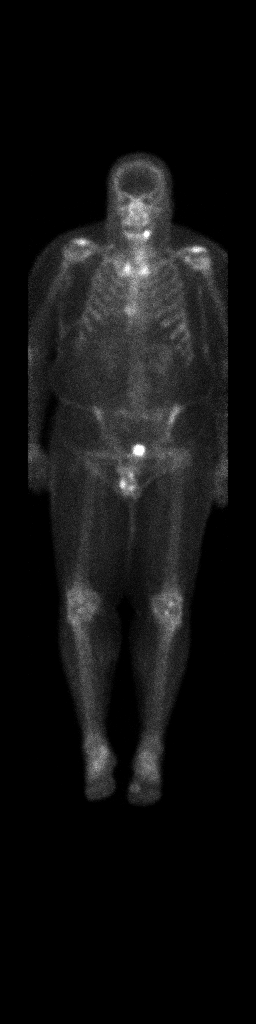

[Series 1: wbr_bone_40 whole body · 2.66mm/px · 1 of 1 slices shown (2 of 2)]
[im 1/1]
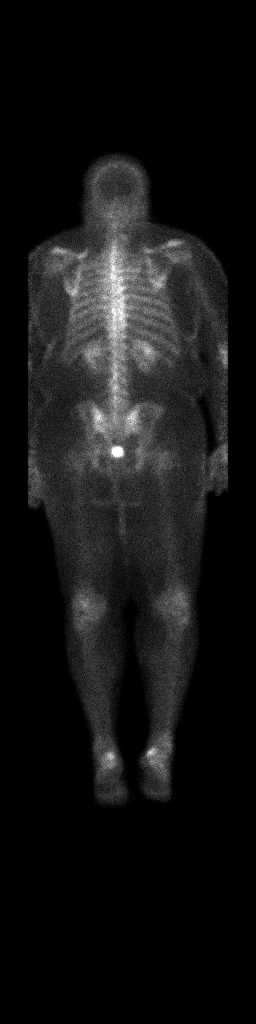

[2 of 2 positions shown; findings below may reference images not displayed]

FINDINGS: No foci of radiotracer accumulation within the axillary or
appendicular skeleton to suggest metastasis. Uptake at the tip of
the sternum is favored benign. Uptake in the LEFT mandible
anteriorly is favored odontogenic in origin.
IMPRESSION: 1. No evidence skeletal metastasis.
2. Focal uptake in the LEFT mandible is favor odontogenic.

## 2020-09-12 ENCOUNTER — Telehealth: Payer: Self-pay | Admitting: Internal Medicine

## 2020-09-12 ENCOUNTER — Other Ambulatory Visit: Payer: Self-pay

## 2020-09-12 ENCOUNTER — Other Ambulatory Visit: Payer: No Typology Code available for payment source

## 2020-09-12 DIAGNOSIS — E119 Type 2 diabetes mellitus without complications: Secondary | ICD-10-CM

## 2020-09-12 NOTE — Telephone Encounter (Signed)
Sent to Dr. John. 

## 2020-09-12 NOTE — Telephone Encounter (Signed)
[place] labs ordered for [employer]

## 2020-09-12 NOTE — Telephone Encounter (Signed)
Pt was scheduled for labs today, but no orders in and Dr. Jenny Reichmann not in the office this morning. He has an appt with Dr. Jenny Reichmann on Friday.  We cancelled appt, please place orders if appropriate and advise patient when he can return for labs.

## 2020-09-13 NOTE — Telephone Encounter (Signed)
Noted that orders are in, called pt to inform but VM full.

## 2020-09-13 NOTE — Telephone Encounter (Signed)
Spoke with pt, advised orders now placed. He will wait until his scheduled appt with Dr. Jenny Reichmann on Friday.

## 2020-09-16 ENCOUNTER — Encounter: Payer: Self-pay | Admitting: Internal Medicine

## 2020-09-16 ENCOUNTER — Ambulatory Visit (INDEPENDENT_AMBULATORY_CARE_PROVIDER_SITE_OTHER): Payer: No Typology Code available for payment source | Admitting: Internal Medicine

## 2020-09-16 ENCOUNTER — Other Ambulatory Visit: Payer: Self-pay

## 2020-09-16 VITALS — BP 140/70 | HR 78 | Temp 98.1°F | Ht 67.0 in | Wt 278.0 lb

## 2020-09-16 DIAGNOSIS — Z23 Encounter for immunization: Secondary | ICD-10-CM

## 2020-09-16 DIAGNOSIS — E1165 Type 2 diabetes mellitus with hyperglycemia: Secondary | ICD-10-CM | POA: Diagnosis not present

## 2020-09-16 DIAGNOSIS — Z0000 Encounter for general adult medical examination without abnormal findings: Secondary | ICD-10-CM

## 2020-09-16 DIAGNOSIS — I10 Essential (primary) hypertension: Secondary | ICD-10-CM

## 2020-09-16 DIAGNOSIS — E785 Hyperlipidemia, unspecified: Secondary | ICD-10-CM | POA: Diagnosis not present

## 2020-09-16 LAB — HEPATIC FUNCTION PANEL
ALT: 14 U/L (ref 0–53)
AST: 17 U/L (ref 0–37)
Albumin: 4.3 g/dL (ref 3.5–5.2)
Alkaline Phosphatase: 42 U/L (ref 39–117)
Bilirubin, Direct: 0.1 mg/dL (ref 0.0–0.3)
Total Bilirubin: 0.4 mg/dL (ref 0.2–1.2)
Total Protein: 7.3 g/dL (ref 6.0–8.3)

## 2020-09-16 LAB — BASIC METABOLIC PANEL
BUN: 25 mg/dL — ABNORMAL HIGH (ref 6–23)
CO2: 31 mEq/L (ref 19–32)
Calcium: 10.2 mg/dL (ref 8.4–10.5)
Chloride: 101 mEq/L (ref 96–112)
Creatinine, Ser: 1.33 mg/dL (ref 0.40–1.50)
GFR: 54.28 mL/min — ABNORMAL LOW (ref >60.00)
Glucose, Bld: 114 mg/dL — ABNORMAL HIGH (ref 70–99)
Potassium: 4.2 mEq/L (ref 3.5–5.1)
Sodium: 140 mEq/L (ref 135–145)

## 2020-09-16 LAB — LIPID PANEL
Cholesterol: 157 mg/dL (ref 0–200)
HDL: 84.3 mg/dL (ref >39.00)
LDL Cholesterol: 61 mg/dL (ref 0–99)
NonHDL: 72.5
Total CHOL/HDL Ratio: 2
Triglycerides: 57 mg/dL (ref 0.0–149.0)
VLDL: 11.4 mg/dL (ref 0.0–40.0)

## 2020-09-16 LAB — HEMOGLOBIN A1C: Hgb A1c MFr Bld: 7.1 % — ABNORMAL HIGH (ref 4.6–6.5)

## 2020-09-16 NOTE — Patient Instructions (Addendum)
You had the flu shot today  Please continue all other medications as before, and refills have been done if requested.  Please have the pharmacy call with any other refills you may need.  Please continue your efforts at being more active, low cholesterol diet, and weight control..  Please keep your appointments with your specialists as you may have planned  We have discussed the Cardiac CT Score test to measure the calcification level (if any) in your heart arteries.  This test has been ordered in our [place], so please call [place] [facility] directly, as they prefer this, at [phone] to be scheduled.  Please go to the LAB at the blood drawing area for the tests to be done  You will be contacted by phone if any changes need to be made immediately.  Otherwise, you will receive a letter about your results with an explanation, but please check with MyChart first.  Please remember to sign up for MyChart if you have not done so, as this will be important to you in the future with finding out test results, communicating by private email, and scheduling acute appointments online when needed.  Please make an Appointment to return in 6 months, or sooner if needed, also with Lab Appointment for testing done 3-5 days before at the [place] (so this is for TWO appointments - please see the scheduling desk as you leave)

## 2020-09-16 NOTE — Progress Notes (Addendum)
Subjective:    Patient ID: Marcus Rivera, male    DOB: September 17, 1951, 69 y.o.   MRN: [id number]  HPI  Here to f/u; overall doing ok,  Pt denies chest pain, increasing sob or doe, wheezing, orthopnea, PND, increased LE swelling, palpitations, dizziness or syncope.  Pt denies new neurological symptoms such as new headache, or facial or extremity weakness or numbness.  Pt denies polydipsia, polyuria, or low sugar episode.  Pt states overall good compliance with meds, mostly trying to follow appropriate diet, with wt overall stable,  but little exercise however. No new complaints Past Medical History:  Diagnosis Date  . ANEMIA-NOS 10/03/2010  . DIABETES MELLITUS, TYPE II 04/12/2008  . GLUCOSE INTOLERANCE 03/12/2008  . Gout   . HYPERLIPIDEMIA 04/12/2008  . HYPERTENSION 03/12/2008  . Prostate cancer ([place])   . PROSTATE CANCER, HX OF 09/05/2009  . PSA, INCREASED 03/18/2008   Past Surgical History:  Procedure Laterality Date  . COLONOSCOPY    . COLONOSCOPY N/A 11/29/2014   Procedure: COLONOSCOPY;  Surgeon: Inda Castle, MD;  Location: [facility];  Service: Endoscopy;  Laterality: N/A;  . negative stress echo  Oct. 17, 2011   [facility]  . PROSTATE SURGERY  2009    reports that he quit smoking about 31 years ago. His smoking use included cigarettes. He has a 16.00 pack-year smoking history. He has never used smokeless tobacco. He reports that he does not drink alcohol and does not use drugs. family history includes Breast cancer in his mother; Cancer in his mother; Colon cancer in his paternal aunt; Diabetes in an other family member; Hypertension in an other family member; Liver disease in his mother; Lung cancer in his mother; Prostate cancer in his father. No Known Allergies Current Outpatient Medications on File Prior to Visit  Medication Sig Dispense Refill  . ascorbic acid (VITAMIN C) 1000 MG tablet Take by mouth.    [name] aspirin 81 MG EC tablet Take 81 mg by mouth daily.  Reports taking this medication approximately twice per week.    . Calcium-Vitamin D 500-125 MG-UNIT TABS Take 1 tablet by mouth daily.     . colchicine 0.6 MG tablet Take 1 tablet (0.6 mg total) by mouth 2 (two) times daily as needed. 60 tablet 1  . hydrocortisone (ANUSOL-HC) 25 MG suppository Place 1 suppository (25 mg total) rectally every 12 (twelve) hours. 12 suppository 1  . ibuprofen (ADVIL) 800 MG tablet Take 800 mg by mouth every 6 (six) hours as needed.    . Magnesium 400 MG CAPS Take by mouth.    . metFORMIN (GLUCOPHAGE) 1000 MG tablet Take 1 tablet (1,000 mg total) by mouth 2 (two) times daily with a meal. 180 tablet 2  . Multiple Vitamin (MULTIVITAMIN) tablet Take 1 tablet by mouth daily.    . Olmesartan-amLODIPine-HCTZ 40-10-25 MG TABS Take 1 tablet by mouth every morning. 90 tablet 2  . ONETOUCH DELICA LANCETS 02V MISC Use to help check blood sugars twice a day Dx E11.9 300 each 3  . pioglitazone (ACTOS) 45 MG tablet TAKE 1 TABLET DAILY 90 tablet 2  . rosuvastatin (CRESTOR) 40 MG tablet Take 1 tablet (40 mg total) by mouth daily. 90 tablet 2   No current facility-administered medications on file prior to visit.   Review of Systems All otherwise neg per pt     Objective:   Physical Exam BP 140/70 (BP Location: Left Arm, Patient Position: Sitting, Cuff Size: Large)   Pulse  78   Temp 98.1 F (36.7 C) (Oral)   Ht 5\' 7"  (1.702 m)   Wt 278 lb (126.1 kg)   SpO2 93%   BMI 43.54 kg/m  VS noted,  Constitutional: Pt appears in NAD HENT: Head: NCAT.  Right Ear: External ear normal.  Left Ear: External ear normal.  Eyes: . Pupils are equal, round, and reactive to light. Conjunctivae and EOM are normal Nose: without d/c or deformity Neck: Neck supple. Gross normal ROM Cardiovascular: Normal rate and regular rhythm.   Pulmonary/Chest: Effort normal and breath sounds without rales or wheezing.  Abd:  Soft, NT, ND, + BS, no organomegaly Neurological: Pt is alert. At baseline  orientation, motor grossly intact Skin: Skin is warm. No rashes, other new lesions, no LE edema Psychiatric: Pt behavior is normal without agitation  All otherwise neg per pt Lab Results  Component Value Date   WBC 2.5 (L) 03/17/2020   HGB 10.5 (L) 03/17/2020   HCT 32.5 (L) 03/17/2020   PLT 211.0 03/17/2020   GLUCOSE 114 (H) 09/16/2020   CHOL 157 09/16/2020   TRIG 57.0 09/16/2020   HDL 84.30 09/16/2020   LDLDIRECT 139.7 09/05/2009   LDLCALC 61 09/16/2020   ALT 14 09/16/2020   AST 17 09/16/2020   NA 140 09/16/2020   K 4.2 09/16/2020   CL 101 09/16/2020   CREATININE 1.33 09/16/2020   BUN 25 (H) 09/16/2020   CO2 31 09/16/2020   TSH 2.23 03/17/2020   PSA 2.20 09/06/2017   HGBA1C 7.1 (H) 09/16/2020   MICROALBUR 1.4 03/17/2020      Assessment & Plan:

## 2020-09-17 ENCOUNTER — Encounter: Payer: Self-pay | Admitting: Internal Medicine

## 2020-09-17 NOTE — Assessment & Plan Note (Signed)
stable overall by history and exam, recent data reviewed with pt, and pt to continue medical treatment as before,  to f/u any worsening symptoms or concerns  

## 2020-09-17 NOTE — Assessment & Plan Note (Addendum)
stable overall by history and exam, recent data reviewed with pt, and pt to continue medical treatment as before,  to f/u any worsening symptoms or concerns, also for ct cardiac score 

## 2020-10-07 ENCOUNTER — Encounter: Payer: Self-pay | Admitting: Internal Medicine

## 2021-01-06 ENCOUNTER — Telehealth: Payer: Self-pay | Admitting: Internal Medicine

## 2021-01-06 NOTE — Telephone Encounter (Signed)
Patient calling to report gout flare up in his great  toe, requesting order for colchicine 0.6 MG tablet to be called to [id number] - [place], [place] [address].

## 2021-01-09 MED ORDER — COLCHICINE 0.6 MG PO TABS: 0.6000 mg | ORAL_TABLET | Freq: Two times a day (BID) | ORAL | 1 refills | Status: DC | PRN

## 2021-01-09 NOTE — Telephone Encounter (Signed)
Ok done

## 2021-03-08 ENCOUNTER — Telehealth: Payer: Self-pay | Admitting: Internal Medicine

## 2021-03-08 NOTE — Telephone Encounter (Signed)
1.Medication Requested: pioglitazone (ACTOS) 45 MG tablet    2. Pharmacy (Name, Street, City):[id number] - [place], [place].   3. On Med List: no   4. Last Visit with PCP: 09/16/20  5. Next visit date with PCP: 03/17/21   Agent: Please be advised that RX refills may take up to 3 business days. We ask that you follow-up with your pharmacy.

## 2021-03-09 MED ORDER — PIOGLITAZONE HCL 45 MG PO TABS: ORAL_TABLET | ORAL | 2 refills | Status: DC

## 2021-03-17 ENCOUNTER — Ambulatory Visit (INDEPENDENT_AMBULATORY_CARE_PROVIDER_SITE_OTHER): Payer: No Typology Code available for payment source | Admitting: Internal Medicine

## 2021-03-17 ENCOUNTER — Other Ambulatory Visit: Payer: Self-pay

## 2021-03-17 ENCOUNTER — Encounter: Payer: Self-pay | Admitting: Internal Medicine

## 2021-03-17 VITALS — BP 120/80 | HR 74 | Temp 98.6°F | Ht 67.0 in | Wt 273.0 lb

## 2021-03-17 DIAGNOSIS — R609 Edema, unspecified: Secondary | ICD-10-CM

## 2021-03-17 DIAGNOSIS — M722 Plantar fascial fibromatosis: Secondary | ICD-10-CM

## 2021-03-17 DIAGNOSIS — N289 Disorder of kidney and ureter, unspecified: Secondary | ICD-10-CM

## 2021-03-17 DIAGNOSIS — Z0001 Encounter for general adult medical examination with abnormal findings: Secondary | ICD-10-CM | POA: Diagnosis not present

## 2021-03-17 DIAGNOSIS — E1165 Type 2 diabetes mellitus with hyperglycemia: Secondary | ICD-10-CM

## 2021-03-17 DIAGNOSIS — E7800 Pure hypercholesterolemia, unspecified: Secondary | ICD-10-CM

## 2021-03-17 DIAGNOSIS — I10 Essential (primary) hypertension: Secondary | ICD-10-CM | POA: Diagnosis not present

## 2021-03-17 LAB — BRAIN NATRIURETIC PEPTIDE: Pro B Natriuretic peptide (BNP): 21 pg/mL (ref 0.0–100.0)

## 2021-03-17 MED ORDER — COLCHICINE 0.6 MG PO TABS: 0.6000 mg | ORAL_TABLET | Freq: Two times a day (BID) | ORAL | 5 refills | Status: DC | PRN

## 2021-03-17 MED ORDER — METFORMIN HCL 1000 MG PO TABS: 1000.0000 mg | ORAL_TABLET | Freq: Two times a day (BID) | ORAL | 3 refills | Status: DC

## 2021-03-17 MED ORDER — OLMESARTAN-AMLODIPINE-HCTZ 40-10-25 MG PO TABS: 1.0000 | ORAL_TABLET | ORAL | 3 refills | Status: DC

## 2021-03-17 MED ORDER — ROSUVASTATIN CALCIUM 40 MG PO TABS: 40.0000 mg | ORAL_TABLET | Freq: Every day | ORAL | 3 refills | Status: DC

## 2021-03-17 MED ORDER — PIOGLITAZONE HCL 45 MG PO TABS: ORAL_TABLET | ORAL | 3 refills | Status: DC

## 2021-03-17 NOTE — Progress Notes (Signed)
Patient ID: Marcus Rivera, male   DOB: [birth year], 70 y.o.   MRN: [id number]         Chief Complaint:: wellness exam and bilateral leg swelling, right heel pain, renal insufficiency       HPI:  Marcus Rivera is a 70 y.o. male here for wellness exam; due for eye exam f/u soon. O/w up to date with preventive referrals and immunizations                        Also c/o right heel pain mild x several months with more walking lately for exercise, worse to first get up in the AM, better after a few steps. Pt denies chest pain, increased sob or doe, wheezing, orthopnea, PND, palpitations, dizziness or syncope, though has several months of mild intermittent bilateral leg swelling below the knees, worse in the PM, seems resolved in the AM  .   Pt denies polydipsia, polyuria, Denies new neuro s/s,   Pt denies fever, night sweats, loss of appetite, or other constitutional symptoms  Denies worsening depressive symptoms, suicidal ideation, or panic;  Lost 5 lbs with better diet.   Wt Readings from Last 3 Encounters:  03/17/21 273 lb (123.8 kg)  09/16/20 278 lb (126.1 kg)  03/17/20 271 lb 3.2 oz (123 kg)   BP Readings from Last 3 Encounters:  03/17/21 120/80  09/16/20 140/70  03/17/20 138/80   Immunization History  Administered Date(s) Administered  . Fluad Quad(high Dose 65+) 09/11/2019, 09/16/2020  . Influenza Whole 09/26/2010  . Influenza, High Dose Seasonal PF 09/06/2017  . Moderna Sars-Covid-2 Vaccination 02/18/2020, 03/16/2020, 10/10/2020  . Pneumococcal Conjugate-13 03/05/2017  . Pneumococcal Polysaccharide-23 09/10/2018  . Tdap 09/04/2016   Health Maintenance Due  Topic Date Due  . HEMOGLOBIN A1C  03/17/2021      Past Medical History:  Diagnosis Date  . ANEMIA-NOS 10/03/2010  . DIABETES MELLITUS, TYPE II 04/12/2008  . GLUCOSE INTOLERANCE 03/12/2008  . Gout   . HYPERLIPIDEMIA 04/12/2008  . HYPERTENSION 03/12/2008  . Prostate cancer ([place])   . PROSTATE CANCER, HX OF 09/05/2009   . PSA, INCREASED 03/18/2008   Past Surgical History:  Procedure Laterality Date  . COLONOSCOPY    . COLONOSCOPY N/A 11/29/2014   Procedure: COLONOSCOPY;  Surgeon: Inda Castle, MD;  Location: [facility];  Service: Endoscopy;  Laterality: N/A;  . negative stress echo  Oct. 17, 2011   [facility]  . PROSTATE SURGERY  2009    reports that he quit smoking about 32 years ago. His smoking use included cigarettes. He has a 16.00 pack-year smoking history. He has never used smokeless tobacco. He reports that he does not drink alcohol and does not use drugs. family history includes Breast cancer in his mother; Cancer in his mother; Colon cancer in his paternal aunt; Diabetes in an other family member; Hypertension in an other family member; Liver disease in his mother; Lung cancer in his mother; Prostate cancer in his father. No Known Allergies Current Outpatient Medications on File Prior to Visit  Medication Sig Dispense Refill  . ascorbic acid (VITAMIN C) 1000 MG tablet Take by mouth.    [name] aspirin 81 MG EC tablet Take 81 mg by mouth daily. Reports taking this medication approximately twice per week.    . Calcium-Vitamin D 500-125 MG-UNIT TABS Take 1 tablet by mouth daily.     . Magnesium 400 MG CAPS Take by mouth.    [name]  Multiple Vitamin (MULTIVITAMIN) tablet Take 1 tablet by mouth daily.    Glory Rosebush DELICA LANCETS 78M MISC Use to help check blood sugars twice a day Dx E11.9 300 each 3   No current facility-administered medications on file prior to visit.        ROS:  All others reviewed and negative.  Objective        PE:  BP 120/80 (BP Location: Left Arm, Patient Position: Sitting, Cuff Size: Large)   Pulse 74   Temp 98.6 F (37 C) (Oral)   Ht 5\' 7"  (1.702 m)   Wt 273 lb (123.8 kg)   SpO2 96%   BMI 42.76 kg/m                 Constitutional: Pt appears in NAD               HENT: Head: NCAT.                Right Ear: External ear normal.                 Left  Ear: External ear normal.                Eyes: . Pupils are equal, round, and reactive to light. Conjunctivae and EOM are normal               Nose: without d/c or deformity               Neck: Neck supple. Gross normal ROM               Cardiovascular: Normal rate and regular rhythm.                 Pulmonary/Chest: Effort normal and breath sounds without rales or wheezing.                Abd:  Soft, NT, ND, + BS, no organomegaly               Neurological: Pt is alert. At baseline orientation, motor grossly intact               Skin: Skin is warm. No rashes, no other new lesions, LE edema - none; right heel with mild tender without ulcer or redness or swelling               Psychiatric: Pt behavior is normal without agitation   Micro: none  Cardiac tracings I have personally interpreted today:  none  Pertinent Radiological findings (summarize): none   Lab Results  Component Value Date   WBC 2.5 (L) 03/17/2020   HGB 10.5 (L) 03/17/2020   HCT 32.5 (L) 03/17/2020   PLT 211.0 03/17/2020   GLUCOSE 114 (H) 09/16/2020   CHOL 157 09/16/2020   TRIG 57.0 09/16/2020   HDL 84.30 09/16/2020   LDLDIRECT 139.7 09/05/2009   LDLCALC 61 09/16/2020   ALT 14 09/16/2020   AST 17 09/16/2020   NA 140 09/16/2020   K 4.2 09/16/2020   CL 101 09/16/2020   CREATININE 1.33 09/16/2020   BUN 25 (H) 09/16/2020   CO2 31 09/16/2020   TSH 2.23 03/17/2020   PSA 2.20 09/06/2017   HGBA1C 7.1 (H) 09/16/2020   MICROALBUR 1.4 03/17/2020   Assessment/Plan:  Marcus Rivera is a 70 y.o. Black or African American [2] male with  has a past medical history of ANEMIA-NOS (10/03/2010), DIABETES MELLITUS, TYPE II (04/12/2008), GLUCOSE INTOLERANCE (03/12/2008), Gout, HYPERLIPIDEMIA (  04/12/2008), HYPERTENSION (03/12/2008), Prostate cancer [facility]), PROSTATE CANCER, HX OF (09/05/2009), and PSA, INCREASED (03/18/2008).  Encounter for well adult exam with abnormal findings Age and sex appropriate education and counseling updated  with regular exercise and diet Referrals for preventative services - pt to call for eye exam for himself Immunizations addressed - none needed Smoking counseling  - none needed Evidence for depression or other mood disorder - none significant Most recent labs reviewed. I have personally reviewed and have noted: 1) the patient's medical and social history 2) The patient's current medications and supplements 3) The patient's height, weight, and BMI have been recorded in the chart   Plantar fasciitis Mild, for tylenol prn, for sports medicine referral for worsening  Hyperlipidemia Lab Results  Component Value Date   LDLCALC 61 09/16/2020   Stable, pt to continue current statin  - crestor 40   Essential hypertension BP Readings from Last 3 Encounters:  03/17/21 120/80  09/16/20 140/70  03/17/20 138/80   Stable, pt to continue medical treatment trizor   Diabetes [facility]) Lab Results  Component Value Date   HGBA1C 7.1 (H) 09/16/2020   Stable, pt to continue current medical treatment metformin, actos   Peripheral edema Mild, c/w venous insufficiency, for leg elevation, low salt, wt control  Renal insufficiency ? ckd - - for f/u labs  Followup: Return in about 6 months (around 09/16/2021).  Cathlean Cower, MD 03/18/2021 6:27 AM [facility]

## 2021-03-17 NOTE — Patient Instructions (Addendum)
Please avoid anti-inflammatories due the kidneys slowing down, such as advil or alleve  Please see Sports medicine on the first floor if the right heel pain gets worse  Please continue all other medications as before, and refills have been done if requested.  Please have the pharmacy call with any other refills you may need.  Please continue your efforts at being more active, low cholesterol diet, and weight control.  You are otherwise up to date with prevention measures today.  Please keep your appointments with your specialists as you may have planned  Please call if you would want the Cardiac CT score testing done  Please go to the LAB at the blood drawing area for the tests to be done  You will be contacted by phone if any changes need to be made immediately.  Otherwise, you will receive a letter about your results with an explanation, but please check with MyChart first.  Please remember to sign up for MyChart if you have not done so, as this will be important to you in the future with finding out test results, communicating by private email, and scheduling acute appointments online when needed.  Please make an Appointment to return in 6 months, or sooner if needed

## 2021-03-18 ENCOUNTER — Encounter: Payer: Self-pay | Admitting: Internal Medicine

## 2021-03-18 DIAGNOSIS — R609 Edema, unspecified: Secondary | ICD-10-CM | POA: Insufficient documentation

## 2021-03-18 DIAGNOSIS — N289 Disorder of kidney and ureter, unspecified: Secondary | ICD-10-CM | POA: Insufficient documentation

## 2021-03-18 DIAGNOSIS — R600 Localized edema: Secondary | ICD-10-CM | POA: Insufficient documentation

## 2021-03-18 DIAGNOSIS — M722 Plantar fascial fibromatosis: Secondary | ICD-10-CM | POA: Insufficient documentation

## 2021-03-18 NOTE — Assessment & Plan Note (Signed)
Mild, for tylenol prn, for sports medicine referral for worsening

## 2021-03-18 NOTE — Assessment & Plan Note (Signed)
Age and sex appropriate education and counseling updated with regular exercise and diet Referrals for preventative services - pt to call for eye exam for himself Immunizations addressed - none needed Smoking counseling  - none needed Evidence for depression or other mood disorder - none significant Most recent labs reviewed. I have personally reviewed and have noted: 1) the patient's medical and social history 2) The patient's current medications and supplements 3) The patient's height, weight, and BMI have been recorded in the chart

## 2021-03-18 NOTE — Assessment & Plan Note (Signed)
BP Readings from Last 3 Encounters:  03/17/21 120/80  09/16/20 140/70  03/17/20 138/80   Stable, pt to continue medical treatment trizor

## 2021-03-18 NOTE — Assessment & Plan Note (Signed)
?   ckd - - for f/u labs

## 2021-03-18 NOTE — Assessment & Plan Note (Signed)
Lab Results  Component Value Date   HGBA1C 7.1 (H) 09/16/2020   Stable, pt to continue current medical treatment metformin, actos

## 2021-03-18 NOTE — Assessment & Plan Note (Signed)
Lab Results  Component Value Date   LDLCALC 61 09/16/2020   Stable, pt to continue current statin  - crestor 40

## 2021-03-18 NOTE — Assessment & Plan Note (Signed)
Mild, c/w venous insufficiency, for leg elevation, low salt, wt control

## 2021-09-18 ENCOUNTER — Other Ambulatory Visit (INDEPENDENT_AMBULATORY_CARE_PROVIDER_SITE_OTHER): Payer: No Typology Code available for payment source

## 2021-09-18 DIAGNOSIS — Z0000 Encounter for general adult medical examination without abnormal findings: Secondary | ICD-10-CM

## 2021-09-18 DIAGNOSIS — E1165 Type 2 diabetes mellitus with hyperglycemia: Secondary | ICD-10-CM | POA: Diagnosis not present

## 2021-09-18 LAB — URINALYSIS, ROUTINE W REFLEX MICROSCOPIC
Bilirubin Urine: NEGATIVE
Hgb urine dipstick: NEGATIVE
Ketones, ur: NEGATIVE
Leukocytes,Ua: NEGATIVE
Nitrite: NEGATIVE
RBC / HPF: NONE SEEN (ref 0–?)
Specific Gravity, Urine: 1.01 (ref 1.000–1.030)
Total Protein, Urine: NEGATIVE
Urine Glucose: NEGATIVE
Urobilinogen, UA: 0.2 — AB (ref 0.0–1.0)
pH: 6 (ref 5.0–8.0)

## 2021-09-18 LAB — LIPID PANEL
Cholesterol: 134 mg/dL (ref 0–200)
HDL: 81 mg/dL (ref >39.00)
LDL Cholesterol: 43 mg/dL (ref 0–99)
NonHDL: 53.12
Total CHOL/HDL Ratio: 2
Triglycerides: 49 mg/dL (ref 0.0–149.0)
VLDL: 9.8 mg/dL (ref 0.0–40.0)

## 2021-09-18 LAB — HEPATIC FUNCTION PANEL
ALT: 13 U/L (ref 0–53)
AST: 18 U/L (ref 0–37)
Albumin: 4.1 g/dL (ref 3.5–5.2)
Alkaline Phosphatase: 42 U/L (ref 39–117)
Bilirubin, Direct: 0.1 mg/dL (ref 0.0–0.3)
Total Bilirubin: 0.5 mg/dL (ref 0.2–1.2)
Total Protein: 6.9 g/dL (ref 6.0–8.3)

## 2021-09-18 LAB — CBC WITH DIFFERENTIAL/PLATELET
Basophils Absolute: 0 10*3/uL (ref 0.0–0.1)
Basophils Relative: 0.5 % (ref 0.0–3.0)
Eosinophils Absolute: 0 10*3/uL (ref 0.0–0.7)
Eosinophils Relative: 1 % (ref 0.0–5.0)
HCT: 30.6 % — ABNORMAL LOW (ref 39.0–52.0)
Hemoglobin: 9.9 g/dL — ABNORMAL LOW (ref 13.0–17.0)
Lymphocytes Relative: 19.7 % (ref 12.0–46.0)
Lymphs Abs: 0.5 10*3/uL — ABNORMAL LOW (ref 0.7–4.0)
MCHC: 32.5 g/dL (ref 30.0–36.0)
MCV: 78.7 fl (ref 78.0–100.0)
Monocytes Absolute: 0.4 10*3/uL (ref 0.1–1.0)
Monocytes Relative: 13.9 % — ABNORMAL HIGH (ref 3.0–12.0)
Neutro Abs: 1.7 10*3/uL (ref 1.4–7.7)
Neutrophils Relative %: 64.9 % (ref 43.0–77.0)
Platelets: 211 10*3/uL (ref 150.0–400.0)
RBC: 3.89 Mil/uL — ABNORMAL LOW (ref 4.22–5.81)
RDW: 17.5 % — ABNORMAL HIGH (ref 11.5–15.5)
WBC: 2.7 10*3/uL — ABNORMAL LOW (ref 4.0–10.5)

## 2021-09-18 LAB — BASIC METABOLIC PANEL
BUN: 14 mg/dL (ref 6–23)
CO2: 29 mEq/L (ref 19–32)
Calcium: 9.5 mg/dL (ref 8.4–10.5)
Chloride: 103 mEq/L (ref 96–112)
Creatinine, Ser: 1.27 mg/dL (ref 0.40–1.50)
GFR: 57.58 mL/min — ABNORMAL LOW (ref >60.00)
Glucose, Bld: 88 mg/dL (ref 70–99)
Potassium: 4.5 mEq/L (ref 3.5–5.1)
Sodium: 138 mEq/L (ref 135–145)

## 2021-09-18 LAB — MICROALBUMIN / CREATININE URINE RATIO
Creatinine,U: 84.3 mg/dL
Microalb Creat Ratio: 1.8 mg/g (ref 0.0–30.0)
Microalb, Ur: 1.5 mg/dL (ref 0.0–1.9)

## 2021-09-18 LAB — PSA: PSA: 0.02 ng/mL — ABNORMAL LOW (ref 0.10–4.00)

## 2021-09-18 LAB — TSH: TSH: 1.03 u[IU]/mL (ref 0.35–5.50)

## 2021-09-18 LAB — HEMOGLOBIN A1C: Hgb A1c MFr Bld: 6.5 % (ref 4.6–6.5)

## 2021-09-21 ENCOUNTER — Encounter: Payer: Self-pay | Admitting: Internal Medicine

## 2021-09-21 ENCOUNTER — Other Ambulatory Visit: Payer: Self-pay

## 2021-09-21 ENCOUNTER — Ambulatory Visit: Payer: No Typology Code available for payment source | Admitting: Internal Medicine

## 2021-09-21 ENCOUNTER — Other Ambulatory Visit: Payer: Self-pay | Admitting: Internal Medicine

## 2021-09-21 VITALS — BP 120/74 | HR 67 | Temp 98.7°F | Ht 67.0 in | Wt 257.0 lb

## 2021-09-21 DIAGNOSIS — E1165 Type 2 diabetes mellitus with hyperglycemia: Secondary | ICD-10-CM

## 2021-09-21 DIAGNOSIS — N1831 Chronic kidney disease, stage 3a: Secondary | ICD-10-CM

## 2021-09-21 DIAGNOSIS — E7800 Pure hypercholesterolemia, unspecified: Secondary | ICD-10-CM

## 2021-09-21 DIAGNOSIS — D649 Anemia, unspecified: Secondary | ICD-10-CM

## 2021-09-21 DIAGNOSIS — E559 Vitamin D deficiency, unspecified: Secondary | ICD-10-CM

## 2021-09-21 DIAGNOSIS — Z23 Encounter for immunization: Secondary | ICD-10-CM

## 2021-09-21 DIAGNOSIS — Z0001 Encounter for general adult medical examination with abnormal findings: Secondary | ICD-10-CM

## 2021-09-21 DIAGNOSIS — E538 Deficiency of other specified B group vitamins: Secondary | ICD-10-CM

## 2021-09-21 DIAGNOSIS — N183 Chronic kidney disease, stage 3 unspecified: Secondary | ICD-10-CM | POA: Insufficient documentation

## 2021-09-21 LAB — CBC WITH DIFFERENTIAL/PLATELET
Basophils Absolute: 0 10*3/uL (ref 0.0–0.1)
Basophils Relative: 0.6 % (ref 0.0–3.0)
Eosinophils Absolute: 0 10*3/uL (ref 0.0–0.7)
Eosinophils Relative: 1.2 % (ref 0.0–5.0)
HCT: 31 % — ABNORMAL LOW (ref 39.0–52.0)
Hemoglobin: 10.1 g/dL — ABNORMAL LOW (ref 13.0–17.0)
Lymphocytes Relative: 18.6 % (ref 12.0–46.0)
Lymphs Abs: 0.5 10*3/uL — ABNORMAL LOW (ref 0.7–4.0)
MCHC: 32.5 g/dL (ref 30.0–36.0)
MCV: 78.5 fl (ref 78.0–100.0)
Monocytes Absolute: 0.3 10*3/uL (ref 0.1–1.0)
Monocytes Relative: 13.3 % — ABNORMAL HIGH (ref 3.0–12.0)
Neutro Abs: 1.7 10*3/uL (ref 1.4–7.7)
Neutrophils Relative %: 66.3 % (ref 43.0–77.0)
Platelets: 216 10*3/uL (ref 150.0–400.0)
RBC: 3.96 Mil/uL — ABNORMAL LOW (ref 4.22–5.81)
RDW: 17.2 % — ABNORMAL HIGH (ref 11.5–15.5)
WBC: 2.5 10*3/uL — ABNORMAL LOW (ref 4.0–10.5)

## 2021-09-21 LAB — VITAMIN B12: Vitamin B-12: 522 pg/mL (ref 211–911)

## 2021-09-21 LAB — IBC PANEL
Iron: 52 ug/dL (ref 42–165)
Saturation Ratios: 12.2 % — ABNORMAL LOW (ref 20.0–50.0)
TIBC: 427 ug/dL (ref 250.0–450.0)
Transferrin: 305 mg/dL (ref 212.0–360.0)

## 2021-09-21 LAB — FERRITIN: Ferritin: 13.9 ng/mL — ABNORMAL LOW (ref 22.0–322.0)

## 2021-09-21 MED ORDER — POLYSACCHARIDE IRON COMPLEX 150 MG PO CAPS: 150.0000 mg | ORAL_CAPSULE | Freq: Every day | ORAL | 1 refills | Status: DC

## 2021-09-21 NOTE — Patient Instructions (Addendum)
You had the flu shot today  Please consider the Shingrix shingles shot anywhere if covered by the insurance  Please remember to call for your yearly eye exam with Dr Katy Fitch  Please consider the Novavax covid vaccine  Please continue all other medications as before, and refills have been done if requested.  Please have the pharmacy call with any other refills you may need.  Please continue your efforts at being more active, low cholesterol diet, and weight control.  You are otherwise up to date with prevention measures today.  Please keep your appointments with your specialists as you may have planned  Please go to the LAB at the blood drawing area for the tests to be done - just a few anemia labs today  You will be contacted by phone if any changes need to be made immediately.  Otherwise, you will receive a letter about your results with an explanation, but please check with MyChart first.  Please remember to sign up for MyChart if you have not done so, as this will be important to you in the future with finding out test results, communicating by private email, and scheduling acute appointments online when needed.  Please make an Appointment to return in 6 months, or sooner if needed, also with Lab Appointment for testing done 3-5 days before at the [facility] (so this is for TWO appointments - please see the scheduling desk as you leave) OR you can go to the [facility] lab without an appt a few days ahead  Due to the ongoing Covid 19 pandemic, our lab now requires an appointment for any labs done at our office.  If you need labs done and do not have an appointment, please call our office ahead of time to schedule before presenting to the lab for your testing.

## 2021-09-21 NOTE — Progress Notes (Signed)
Patient ID: Marcus Rivera, male   DOB: October 16, 1951, 70 y.o.   MRN: [id number]        Chief Complaint: follow up HTN, HLD and hyperglycemia, ckd, anemia, gerd       HPI:  Marcus Rivera is a 70 y.o. male here overall doing ok, Pt denies chest pain, increased sob or doe, wheezing, orthopnea, PND, increased LE swelling, palpitations, dizziness or syncope.   Pt denies polydipsia, polyuria, or new focal neuro s/s.   Pt denies fever, wt loss, night sweats, loss of appetite, or other constitutional symptoms  No overt bleeding.  Has had mild worsening reflux, but no abd pain, dysphagia, n/v, bowel change or blood.      Wt Readings from Last 3 Encounters:  09/21/21 257 lb (116.6 kg)  03/17/21 273 lb (123.8 kg)  09/16/20 278 lb (126.1 kg)   BP Readings from Last 3 Encounters:  09/21/21 120/74  03/17/21 120/80  09/16/20 140/70         Past Medical History:  Diagnosis Date   ANEMIA-NOS 10/03/2010   DIABETES MELLITUS, TYPE II 04/12/2008   GLUCOSE INTOLERANCE 03/12/2008   Gout    HYPERLIPIDEMIA 04/12/2008   HYPERTENSION 03/12/2008   Prostate cancer ([place])    PROSTATE CANCER, HX OF 09/05/2009   PSA, INCREASED 03/18/2008   Past Surgical History:  Procedure Laterality Date   COLONOSCOPY     COLONOSCOPY N/A 11/29/2014   Procedure: COLONOSCOPY;  Surgeon: Inda Castle, MD;  Location: [facility];  Service: Endoscopy;  Laterality: N/A;   negative stress echo  Oct. 17, 2011   [facility]  2009    reports that he quit smoking about 32 years ago. His smoking use included cigarettes. He has a 16.00 pack-year smoking history. He has never used smokeless tobacco. He reports that he does not drink alcohol and does not use drugs. family history includes Breast cancer in his mother; Cancer in his mother; Colon cancer in his paternal aunt; Diabetes in an other family member; Hypertension in an other family member; Liver disease in his mother; Lung cancer in his mother;  Prostate cancer in his father. No Known Allergies Current Outpatient Medications on File Prior to Visit  Medication Sig Dispense Refill   ascorbic acid (VITAMIN C) 1000 MG tablet Take by mouth.     aspirin 81 MG EC tablet Take 81 mg by mouth daily. Reports taking this medication approximately twice per week.     Calcium-Vitamin D 500-125 MG-UNIT TABS Take 1 tablet by mouth daily.      colchicine 0.6 MG tablet Take 1 tablet (0.6 mg total) by mouth 2 (two) times daily as needed. 60 tablet 5   Magnesium 400 MG CAPS Take by mouth.     metFORMIN (GLUCOPHAGE) 1000 MG tablet Take 1 tablet (1,000 mg total) by mouth 2 (two) times daily with a meal. 180 tablet 3   Multiple Vitamin (MULTIVITAMIN) tablet Take 1 tablet by mouth daily.     Olmesartan-amLODIPine-HCTZ 40-10-25 MG TABS Take 1 tablet by mouth every morning. 90 tablet 3   ONETOUCH DELICA LANCETS 85I MISC Use to help check blood sugars twice a day Dx E11.9 300 each 3   pioglitazone (ACTOS) 45 MG tablet TAKE 1 TABLET DAILY 90 tablet 3   rosuvastatin (CRESTOR) 40 MG tablet Take 1 tablet (40 mg total) by mouth daily. 90 tablet 3   No current facility-administered medications on file prior to visit.  ROS:  All others reviewed and negative.  Objective        PE:  BP 120/74 (BP Location: Left Arm, Patient Position: Sitting, Cuff Size: Large)   Pulse 67   Temp 98.7 F (37.1 C) (Oral)   Ht 5\' 7"  (1.702 m)   Wt 257 lb (116.6 kg)   SpO2 100%   BMI 40.25 kg/m                 Constitutional: Pt appears in NAD               HENT: Head: NCAT.                Right Ear: External ear normal.                 Left Ear: External ear normal.                Eyes: . Pupils are equal, round, and reactive to light. Conjunctivae and EOM are normal               Nose: without d/c or deformity               Neck: Neck supple. Gross normal ROM               Cardiovascular: Normal rate and regular rhythm.                 Pulmonary/Chest: Effort normal  and breath sounds without rales or wheezing.                Abd:  Soft, NT, ND, + BS, no organomegaly               Neurological: Pt is alert. At baseline orientation, motor grossly intact               Skin: Skin is warm. No rashes, no other new lesions, LE edema - none               Psychiatric: Pt behavior is normal without agitation   Micro: none  Cardiac tracings I have personally interpreted today:  none  Pertinent Radiological findings (summarize): none   Lab Results  Component Value Date   WBC 2.5 (L) 09/21/2021   HGB 10.1 (L) 09/21/2021   HCT 31.0 (L) 09/21/2021   PLT 216.0 09/21/2021   GLUCOSE 88 09/18/2021   CHOL 134 09/18/2021   TRIG 49.0 09/18/2021   HDL 81.00 09/18/2021   LDLDIRECT 139.7 09/05/2009   LDLCALC 43 09/18/2021   ALT 13 09/18/2021   AST 18 09/18/2021   NA 138 09/18/2021   K 4.5 09/18/2021   CL 103 09/18/2021   CREATININE 1.27 09/18/2021   BUN 14 09/18/2021   CO2 29 09/18/2021   TSH 1.03 09/18/2021   PSA 0.02 Repeated and verified X2. (L) 09/18/2021   HGBA1C 6.5 09/18/2021   MICROALBUR 1.5 09/18/2021   Assessment/Plan:  Marcus Rivera is a 70 y.o. Black or African American [2] male with  has a past medical history of ANEMIA-NOS (10/03/2010), DIABETES MELLITUS, TYPE II (04/12/2008), GLUCOSE INTOLERANCE (03/12/2008), Gout, HYPERLIPIDEMIA (04/12/2008), HYPERTENSION (03/12/2008), Prostate cancer ([place]), PROSTATE CANCER, HX OF (09/05/2009), and PSA, INCREASED (03/18/2008).  Diabetes [facility]) Lab Results  Component Value Date   HGBA1C 6.5 09/18/2021   Stable, pt to continue current medical treatment metformin   Hyperlipidemia Lab Results  Component Value Date   LDLCALC 43 09/18/2021   Stable, pt to continue current  statin crestor 40   ANEMIA-NOS Mild worse, no overt bleeding, for iron labs, b12,  to f/u any worsening symptoms or concerns  CKD (chronic kidney disease) stage 3, GFR 30-59 ml/min (HCC) Lab Results  Component Value Date   CREATININE  1.27 09/18/2021   Stable overall, cont to avoid nephrotoxins  Followup: Return in about 6 months (around 03/22/2022).  Cathlean Cower, MD 09/23/2021 11:00 PM [facility]

## 2021-09-23 ENCOUNTER — Encounter: Payer: Self-pay | Admitting: Internal Medicine

## 2021-09-23 NOTE — Assessment & Plan Note (Signed)
Lab Results  Component Value Date   CREATININE 1.27 09/18/2021   Stable overall, cont to avoid nephrotoxins

## 2021-09-23 NOTE — Assessment & Plan Note (Signed)
Mild worse, no overt bleeding, for iron labs, b12,  to f/u any worsening symptoms or concerns

## 2021-09-23 NOTE — Assessment & Plan Note (Signed)
Lab Results  Component Value Date   HGBA1C 6.5 09/18/2021   Stable, pt to continue current medical treatment metformin

## 2021-09-23 NOTE — Assessment & Plan Note (Signed)
Lab Results  Component Value Date   LDLCALC 43 09/18/2021   Stable, pt to continue current statin crestor 40

## 2021-09-26 NOTE — Telephone Encounter (Signed)
Staff to assist pt with Nurse Visit for shingles shot

## 2021-09-29 NOTE — Telephone Encounter (Signed)
Patient has been scheduled

## 2021-10-06 ENCOUNTER — Ambulatory Visit (INDEPENDENT_AMBULATORY_CARE_PROVIDER_SITE_OTHER): Payer: No Typology Code available for payment source

## 2021-10-06 ENCOUNTER — Other Ambulatory Visit: Payer: Self-pay

## 2021-10-06 DIAGNOSIS — Z23 Encounter for immunization: Secondary | ICD-10-CM | POA: Diagnosis not present

## 2021-10-06 NOTE — Progress Notes (Signed)
Pt given 1st shingles vacc w/o any complications.

## 2021-12-20 LAB — HM DIABETES EYE EXAM

## 2022-03-17 ENCOUNTER — Other Ambulatory Visit: Payer: Self-pay | Admitting: Internal Medicine

## 2022-03-17 MED ORDER — OLMESARTAN-AMLODIPINE-HCTZ 40-10-25 MG PO TABS: 1.0000 | ORAL_TABLET | ORAL | 1 refills | Status: DC

## 2022-03-21 ENCOUNTER — Other Ambulatory Visit (INDEPENDENT_AMBULATORY_CARE_PROVIDER_SITE_OTHER): Payer: No Typology Code available for payment source

## 2022-03-21 ENCOUNTER — Other Ambulatory Visit: Payer: Self-pay | Admitting: Internal Medicine

## 2022-03-21 DIAGNOSIS — E559 Vitamin D deficiency, unspecified: Secondary | ICD-10-CM | POA: Diagnosis not present

## 2022-03-21 DIAGNOSIS — E538 Deficiency of other specified B group vitamins: Secondary | ICD-10-CM

## 2022-03-21 DIAGNOSIS — E1165 Type 2 diabetes mellitus with hyperglycemia: Secondary | ICD-10-CM

## 2022-03-21 DIAGNOSIS — D649 Anemia, unspecified: Secondary | ICD-10-CM | POA: Diagnosis not present

## 2022-03-21 DIAGNOSIS — Z0001 Encounter for general adult medical examination with abnormal findings: Secondary | ICD-10-CM | POA: Diagnosis not present

## 2022-03-21 LAB — CBC WITH DIFFERENTIAL/PLATELET
Basophils Absolute: 0 10*3/uL (ref 0.0–0.1)
Basophils Relative: 0.6 % (ref 0.0–3.0)
Eosinophils Absolute: 0 10*3/uL (ref 0.0–0.7)
Eosinophils Relative: 1.4 % (ref 0.0–5.0)
HCT: 31 % — ABNORMAL LOW (ref 39.0–52.0)
Hemoglobin: 10.4 g/dL — ABNORMAL LOW (ref 13.0–17.0)
Lymphocytes Relative: 22.9 % (ref 12.0–46.0)
Lymphs Abs: 0.5 10*3/uL — ABNORMAL LOW (ref 0.7–4.0)
MCHC: 33.5 g/dL (ref 30.0–36.0)
MCV: 79 fl (ref 78.0–100.0)
Monocytes Absolute: 0.4 10*3/uL (ref 0.1–1.0)
Monocytes Relative: 15.8 % — ABNORMAL HIGH (ref 3.0–12.0)
Neutro Abs: 1.4 10*3/uL (ref 1.4–7.7)
Neutrophils Relative %: 59.3 % (ref 43.0–77.0)
Platelets: 196 10*3/uL (ref 150.0–400.0)
RBC: 3.92 Mil/uL — ABNORMAL LOW (ref 4.22–5.81)
RDW: 16.5 % — ABNORMAL HIGH (ref 11.5–15.5)
WBC: 2.3 10*3/uL — ABNORMAL LOW (ref 4.0–10.5)

## 2022-03-21 LAB — URINALYSIS, ROUTINE W REFLEX MICROSCOPIC
Bilirubin Urine: NEGATIVE
Hgb urine dipstick: NEGATIVE
Ketones, ur: NEGATIVE
Leukocytes,Ua: NEGATIVE
Nitrite: NEGATIVE
RBC / HPF: NONE SEEN (ref 0–?)
Specific Gravity, Urine: 1.01 (ref 1.000–1.030)
Total Protein, Urine: 30 — AB
Urine Glucose: NEGATIVE
Urobilinogen, UA: 0.2 (ref 0.0–1.0)
WBC, UA: NONE SEEN (ref 0–?)
pH: 7 (ref 5.0–8.0)

## 2022-03-21 LAB — BASIC METABOLIC PANEL
BUN: 21 mg/dL (ref 6–23)
CO2: 30 mEq/L (ref 19–32)
Calcium: 9.8 mg/dL (ref 8.4–10.5)
Chloride: 101 mEq/L (ref 96–112)
Creatinine, Ser: 1.37 mg/dL (ref 0.40–1.50)
GFR: 52.39 mL/min — ABNORMAL LOW (ref >60.00)
Glucose, Bld: 102 mg/dL — ABNORMAL HIGH (ref 70–99)
Potassium: 4.6 mEq/L (ref 3.5–5.1)
Sodium: 140 mEq/L (ref 135–145)

## 2022-03-21 LAB — HEPATIC FUNCTION PANEL
ALT: 10 U/L (ref 0–53)
AST: 18 U/L (ref 0–37)
Albumin: 4.4 g/dL (ref 3.5–5.2)
Alkaline Phosphatase: 36 U/L — ABNORMAL LOW (ref 39–117)
Bilirubin, Direct: 0.1 mg/dL (ref 0.0–0.3)
Total Bilirubin: 0.6 mg/dL (ref 0.2–1.2)
Total Protein: 6.7 g/dL (ref 6.0–8.3)

## 2022-03-21 LAB — VITAMIN B12: Vitamin B-12: 526 pg/mL (ref 211–911)

## 2022-03-21 LAB — LIPID PANEL
Cholesterol: 139 mg/dL (ref 0–200)
HDL: 84.2 mg/dL (ref >39.00)
LDL Cholesterol: 43 mg/dL (ref 0–99)
NonHDL: 55.27
Total CHOL/HDL Ratio: 2
Triglycerides: 62 mg/dL (ref 0.0–149.0)
VLDL: 12.4 mg/dL (ref 0.0–40.0)

## 2022-03-21 LAB — VITAMIN D 25 HYDROXY (VIT D DEFICIENCY, FRACTURES): VITD: 51.89 ng/mL (ref 30.00–100.00)

## 2022-03-21 LAB — IBC PANEL
Iron: 68 ug/dL (ref 42–165)
Saturation Ratios: 15.8 % — ABNORMAL LOW (ref 20.0–50.0)
TIBC: 429.8 ug/dL (ref 250.0–450.0)
Transferrin: 307 mg/dL (ref 212.0–360.0)

## 2022-03-21 LAB — MICROALBUMIN / CREATININE URINE RATIO
Creatinine,U: 112.8 mg/dL
Microalb Creat Ratio: 5.8 mg/g (ref 0.0–30.0)
Microalb, Ur: 6.5 mg/dL — ABNORMAL HIGH (ref 0.0–1.9)

## 2022-03-21 LAB — HEMOGLOBIN A1C: Hgb A1c MFr Bld: 6.4 % (ref 4.6–6.5)

## 2022-03-21 LAB — FERRITIN: Ferritin: 14.4 ng/mL — ABNORMAL LOW (ref 22.0–322.0)

## 2022-03-21 LAB — TSH: TSH: 1.81 u[IU]/mL (ref 0.35–5.50)

## 2022-03-21 LAB — PSA: PSA: 0 ng/mL — ABNORMAL LOW (ref 0.10–4.00)

## 2022-03-21 MED ORDER — POLYSACCHARIDE IRON COMPLEX 150 MG PO CAPS: 150.0000 mg | ORAL_CAPSULE | Freq: Every day | ORAL | 1 refills | Status: DC

## 2022-03-26 ENCOUNTER — Ambulatory Visit (INDEPENDENT_AMBULATORY_CARE_PROVIDER_SITE_OTHER): Payer: No Typology Code available for payment source | Admitting: Internal Medicine

## 2022-03-26 VITALS — BP 122/68 | HR 65 | Temp 97.8°F | Ht 67.0 in | Wt 233.0 lb

## 2022-03-26 DIAGNOSIS — I10 Essential (primary) hypertension: Secondary | ICD-10-CM

## 2022-03-26 DIAGNOSIS — E611 Iron deficiency: Secondary | ICD-10-CM

## 2022-03-26 DIAGNOSIS — D509 Iron deficiency anemia, unspecified: Secondary | ICD-10-CM

## 2022-03-26 DIAGNOSIS — Z0001 Encounter for general adult medical examination with abnormal findings: Secondary | ICD-10-CM

## 2022-03-26 DIAGNOSIS — N1831 Chronic kidney disease, stage 3a: Secondary | ICD-10-CM

## 2022-03-26 DIAGNOSIS — E559 Vitamin D deficiency, unspecified: Secondary | ICD-10-CM

## 2022-03-26 DIAGNOSIS — E7800 Pure hypercholesterolemia, unspecified: Secondary | ICD-10-CM | POA: Diagnosis not present

## 2022-03-26 DIAGNOSIS — E1165 Type 2 diabetes mellitus with hyperglycemia: Secondary | ICD-10-CM

## 2022-03-26 MED ORDER — OLMESARTAN-AMLODIPINE-HCTZ 40-10-25 MG PO TABS: 1.0000 | ORAL_TABLET | ORAL | 3 refills | Status: DC

## 2022-03-26 MED ORDER — ROSUVASTATIN CALCIUM 40 MG PO TABS: 40.0000 mg | ORAL_TABLET | Freq: Every day | ORAL | 3 refills | Status: DC

## 2022-03-26 MED ORDER — PIOGLITAZONE HCL 45 MG PO TABS: ORAL_TABLET | ORAL | 3 refills | Status: DC

## 2022-03-26 MED ORDER — METFORMIN HCL 1000 MG PO TABS: 1000.0000 mg | ORAL_TABLET | Freq: Two times a day (BID) | ORAL | 3 refills | Status: DC

## 2022-03-26 NOTE — Patient Instructions (Addendum)
Please remind [place] to do your Shingles shot #2 at your next visit ? ?Please take the iron as we suggested ? ?You will be contacted regarding the referral for: Gastroenterology ? ?Please continue all other medications as before, and refills have been done if requested. ? ?Please have the pharmacy call with any other refills you may need. ? ?Please continue your efforts at being more active, low cholesterol diet, and weight control. ? ?You are otherwise up to date with prevention measures today. ? ?Please keep your appointments with your specialists as you may have planned ? ?Please make an Appointment to return in 6 months, or sooner if needed, also with Lab Appointment for testing done 3-5 days before at the [facility] (so this is for TWO appointments - please see the scheduling desk as you leave) ? ?Due to the ongoing Covid 19 pandemic, our lab now requires an appointment for any labs done at our office.  If you need labs done and do not have an appointment, please call our office ahead of time to schedule before presenting to the lab for your testing. ? ? ?

## 2022-03-26 NOTE — Progress Notes (Signed)
Patient ID: Marcus Rivera, male   DOB: 03/09/1951, 71 y.o.   MRN: [id number] ? ? ? ?     Chief Complaint:: wellness exam and Follow-up (Would like to discuss shingles vaccine) ? , low iron, dm ? ?     HPI:  Marcus Rivera is a 71 y.o. male here for wellness exam; decliens covid booster and after d/w pt will have shingrix hext visit; o/w up to date ?         ?              Also no recent overt bleeding.  Has recent low iron indices.  Pt denies chest pain, increased sob or doe, wheezing, orthopnea, PND, increased LE swelling, palpitations, dizziness or syncope.   Pt denies polydipsia, polyuria, or new focal neuro s/s.    Pt denies fever, wt loss, night sweats, loss of appetite, or other constitutional symptoms  Denies worsening reflux, abd pain, dysphagia, n/v, bowel change or blood.    Wt has been down with better diet.   ?Wt Readings from Last 3 Encounters:  ?03/26/22 233 lb (105.7 kg)  ?09/21/21 257 lb (116.6 kg)  ?03/17/21 273 lb (123.8 kg)  ? ?BP Readings from Last 3 Encounters:  ?03/26/22 122/68  ?09/21/21 120/74  ?03/17/21 120/80  ? ?Immunization History  ?Administered Date(s) Administered  ? Fluad Quad(high Dose 65+) 09/11/2019, 09/16/2020, 09/21/2021  ? Influenza Whole 09/26/2010  ? Influenza, High Dose Seasonal PF 09/06/2017  ? Moderna Sars-Covid-2 Vaccination 02/18/2020, 03/16/2020, 10/10/2020  ? Pneumococcal Conjugate-13 03/05/2017  ? Pneumococcal Polysaccharide-23 09/10/2018  ? Tdap 09/04/2016  ? Zoster Recombinat (Shingrix) 10/06/2021  ? ?There are no preventive care reminders to display for this patient. ? ?  ? ?Past Medical History:  ?Diagnosis Date  ? ANEMIA-NOS 10/03/2010  ? DIABETES MELLITUS, TYPE II 04/12/2008  ? GLUCOSE INTOLERANCE 03/12/2008  ? Gout   ? HYPERLIPIDEMIA 04/12/2008  ? HYPERTENSION 03/12/2008  ? Prostate cancer ([place])   ? PROSTATE CANCER, HX OF 09/05/2009  ? PSA, INCREASED 03/18/2008  ? ?Past Surgical History:  ?Procedure Laterality Date  ? COLONOSCOPY    ? COLONOSCOPY N/A 11/29/2014   ? Procedure: COLONOSCOPY;  Surgeon: Inda Castle, MD;  Location: [facility];  Service: Endoscopy;  Laterality: N/A;  ? negative stress echo  Oct. 17, 2011  ? [place]  ? PROSTATE SURGERY  2009  ? ? reports that he quit smoking about 33 years ago. His smoking use included cigarettes. He has a 16.00 pack-year smoking history. He has never used smokeless tobacco. He reports that he does not drink alcohol and does not use drugs. ?family history includes Breast cancer in his mother; Cancer in his mother; Colon cancer in his paternal aunt; Diabetes in an other family member; Hypertension in an other family member; Liver disease in his mother; Lung cancer in his mother; Prostate cancer in his father. ?No Known Allergies ?Current Outpatient Medications on File Prior to Visit  ?Medication Sig Dispense Refill  ? ascorbic acid (VITAMIN C) 1000 MG tablet Take by mouth.    ? aspirin 81 MG EC tablet Take 81 mg by mouth daily. Reports taking this medication approximately twice per week.    ? Calcium-Vitamin D 500-125 MG-UNIT TABS Take 1 tablet by mouth daily.     ? colchicine 0.6 MG tablet Take 1 tablet (0.6 mg total) by mouth 2 (two) times daily as needed. 60 tablet 5  ? iron polysaccharides (NU-IRON) 150 MG capsule Take 1  capsule (150 mg total) by mouth daily. 90 capsule 1  ? Magnesium 400 MG CAPS Take by mouth.    ? Multiple Vitamin (MULTIVITAMIN) tablet Take 1 tablet by mouth daily.    ? ONETOUCH DELICA LANCETS 69S MISC Use to help check blood sugars twice a day ?Dx E11.9 300 each 3  ? ?No current facility-administered medications on file prior to visit.  ? ?     ROS:  All others reviewed and negative. ? ?Objective  ? ?     PE:  BP 122/68 (BP Location: Left Arm, Patient Position: Sitting, Cuff Size: Large)   Pulse 65   Temp 97.8 ?F (36.6 ?C) (Oral)   Ht '5\' 7"'$  (1.702 m)   Wt 233 lb (105.7 kg)   SpO2 93%   BMI 36.49 kg/m?  ? ?              Constitutional: Pt appears in NAD ?              HENT: Head:  NCAT.  ?              Right Ear: External ear normal.   ?              Left Ear: External ear normal.  ?              Eyes: . Pupils are equal, round, and reactive to light. Conjunctivae and EOM are normal ?              Nose: without d/c or deformity ?              Neck: Neck supple. Gross normal ROM ?              Cardiovascular: Normal rate and regular rhythm.   ?              Pulmonary/Chest: Effort normal and breath sounds without rales or wheezing.  ?              Abd:  Soft, NT, ND, + BS, no organomegaly ?              Neurological: Pt is alert. At baseline orientation, motor grossly intact ?              Skin: Skin is warm. No rashes, no other new lesions, LE edema - none ?              Psychiatric: Pt behavior is normal without agitation  ? ?Micro: none ? ?Cardiac tracings I have personally interpreted today:  none ? ?Pertinent Radiological findings (summarize): none  ? ?Lab Results  ?Component Value Date  ? WBC 2.3 (L) 03/21/2022  ? HGB 10.4 (L) 03/21/2022  ? HCT 31.0 (L) 03/21/2022  ? PLT 196.0 03/21/2022  ? GLUCOSE 102 (H) 03/21/2022  ? CHOL 139 03/21/2022  ? TRIG 62.0 03/21/2022  ? HDL 84.20 03/21/2022  ? LDLDIRECT 139.7 09/05/2009  ? [place] 43 03/21/2022  ? ALT 10 03/21/2022  ? AST 18 03/21/2022  ? NA 140 03/21/2022  ? K 4.6 03/21/2022  ? CL 101 03/21/2022  ? CREATININE 1.37 03/21/2022  ? BUN 21 03/21/2022  ? CO2 30 03/21/2022  ? TSH 1.81 03/21/2022  ? PSA 0.00 (L) 03/21/2022  ? HGBA1C 6.4 03/21/2022  ? MICROALBUR 6.5 (H) 03/21/2022  ? ?Ferritin 22.0 - 322.0 ng/mL 14.4 Low   13.9 Low    ? ?Assessment/Plan:  ?Marcus Rivera is a 71  y.o. [place] American [2] male with  has a past medical history of ANEMIA-NOS (10/03/2010), DIABETES MELLITUS, TYPE II (04/12/2008), GLUCOSE INTOLERANCE (03/12/2008), Gout, HYPERLIPIDEMIA (04/12/2008), HYPERTENSION (03/12/2008), Prostate cancer ([place]), PROSTATE CANCER, HX OF (09/05/2009), and PSA, INCREASED (03/18/2008). ? ?Encounter for well adult exam with abnormal  findings ?Age and sex appropriate education and counseling updated with regular exercise and diet ?Referrals for preventative services - none needed ?Immunizations addressed - declines covid booster, for shingrix next visit ?Smoking counseling  - none needed ?Evidence for depression or other mood disorder - none significant ?Most recent labs reviewed. ?I have personally reviewed and have noted: ?1) the patient's medical and social history ?2) The patient's current medications and supplements ?3) The patient's height, weight, and BMI have been recorded in the chart ? ? ?Iron deficiency anemia ?Mild, no overt bleeding, pt to continue iron supplementation, for GI referral for further eval and tx, f/u iron labs next visit ? ?Ferritin 22.0 - 322.0 ng/mL 14.4 Low   13.9 Low    ? ? ? ?Diabetes ([place]) ?Lab Results  ?Component Value Date  ? HGBA1C 6.4 03/21/2022  ? ?Stable, pt to continue current medical treatment metformin, actos ? ?Essential hypertension ?BP Readings from Last 3 Encounters:  ?03/26/22 122/68  ?09/21/21 120/74  ?03/17/21 120/80  ? ?Stable, pt to continue medical treatment tribenzor ? ? ?Hyperlipidemia ?Lab Results  ?Component Value Date  ? [place] 43 03/21/2022  ? ?Stable, pt to continue current statin crestor 40 ? ? ?CKD (chronic kidney disease) stage 3, GFR 30-59 ml/min (HCC) ?Lab Results  ?Component Value Date  ? CREATININE 1.37 03/21/2022  ? ?Stable overall, cont to avoid nephrotoxins ? ?Followup: Return in about 6 months (around 09/25/2022). ? ?Cathlean Cower, MD 03/31/2022 10:46 AM ?[place] ?[place] ?Internal Medicine ?

## 2022-03-31 ENCOUNTER — Encounter: Payer: Self-pay | Admitting: Internal Medicine

## 2022-03-31 NOTE — Assessment & Plan Note (Signed)
Lab Results  ?Component Value Date  ? CREATININE 1.37 03/21/2022  ? ?Stable overall, cont to avoid nephrotoxins ? ?

## 2022-03-31 NOTE — Assessment & Plan Note (Addendum)
Mild, no overt bleeding, pt to continue iron supplementation, for GI referral for further eval and tx, f/u iron labs next visit ? ?Ferritin 22.0 - 322.0 ng/mL 14.4?Low?  13.9?Low?   ? ? ?

## 2022-03-31 NOTE — Assessment & Plan Note (Signed)
Lab Results  ?Component Value Date  ? [place] 43 03/21/2022  ? ?Stable, pt to continue current statin crestor 40 ? ?

## 2022-03-31 NOTE — Assessment & Plan Note (Signed)
BP Readings from Last 3 Encounters:  ?03/26/22 122/68  ?09/21/21 120/74  ?03/17/21 120/80  ? ?Stable, pt to continue medical treatment tribenzor ? ?

## 2022-03-31 NOTE — Assessment & Plan Note (Signed)
Lab Results  ?Component Value Date  ? HGBA1C 6.4 03/21/2022  ? ?Stable, pt to continue current medical treatment metformin, actos ?

## 2022-03-31 NOTE — Assessment & Plan Note (Signed)
Age and sex appropriate education and counseling updated with regular exercise and diet ?Referrals for preventative services - none needed ?Immunizations addressed - declines covid booster, for shingrix next visit ?Smoking counseling  - none needed ?Evidence for depression or other mood disorder - none significant ?Most recent labs reviewed. ?I have personally reviewed and have noted: ?1) the patient's medical and social history ?2) The patient's current medications and supplements ?3) The patient's height, weight, and BMI have been recorded in the chart ? ?

## 2022-05-10 ENCOUNTER — Encounter: Payer: Self-pay | Admitting: Gastroenterology

## 2022-06-13 ENCOUNTER — Ambulatory Visit: Payer: No Typology Code available for payment source | Admitting: Gastroenterology

## 2022-06-13 ENCOUNTER — Encounter: Payer: Self-pay | Admitting: Gastroenterology

## 2022-06-13 VITALS — BP 118/70 | HR 66 | Ht 66.0 in | Wt 229.1 lb

## 2022-06-13 DIAGNOSIS — D509 Iron deficiency anemia, unspecified: Secondary | ICD-10-CM | POA: Diagnosis not present

## 2022-06-13 MED ORDER — CLENPIQ 10-3.5-12 MG-GM -GM/175ML PO SOLN: 1.0000 | Freq: Once | ORAL | 0 refills | Status: AC

## 2022-06-13 NOTE — Progress Notes (Signed)
Chief Complaint: Iron deficiency anemia   Referring Provider:     [name], MD    HPI:     Marcus Rivera is a 71 y.o. male with history of diabetes, HTN, HLD, CKD 3, Prostate CA s/p prostatectomy 2009 then radiation 2021, referred to the [facility] for evaluation of iron deficiency anemia.  He denies any hematochezia, melena and no n/v/f/c/d/c. Otherwise good PO intake. No hx of blood transfusion, IV iron.   -09/21/2021: H/H 10.1/31 with MCV/RDW 78.5/17.  Ferritin 13.9, iron 52, TIBC 427, sat 12.2%.  Started on oral iron supplement -03/21/2022: H/H 10.4/31 with MCV/RDW 79/16.5.   Ferritin 14.4, iron 68, TIBC 03/11/2028, sat 15.8%.   Endoscopic History: - EGD about 1988 per patient and normal - Colonoscopy (11/2014): Sigmoid diverticulosis, otherwise normal   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   DIABETES MELLITUS, TYPE II 04/12/2008   GLUCOSE INTOLERANCE 03/12/2008   Gout    HYPERLIPIDEMIA 04/12/2008   HYPERTENSION 03/12/2008   Prostate cancer ([place])    PROSTATE CANCER, HX OF 09/05/2009   PSA, INCREASED 03/18/2008     Past Surgical History:  Procedure Laterality Date   COLONOSCOPY     COLONOSCOPY N/A 11/29/2014   Procedure: COLONOSCOPY;  Surgeon: Inda Castle, MD;  Location: [facility];  Service: Endoscopy;  Laterality: N/A;   ESOPHAGOGASTRODUODENOSCOPY     Late 1980's [facility] [place]   negative stress echo  09/25/2010   [place] SURGERY  12/11/2007   Family History  Problem Relation Age of Onset   Cancer Mother        carcinoid tumor 1991,   Liver disease Mother        s/p partial liver resection   Lung cancer Mother    Breast cancer Mother    Hypertension Other    Diabetes Other    Colon cancer Paternal Aunt    Prostate cancer Father    Rectal cancer Neg Hx    Stomach cancer Neg Hx    Social  History   Tobacco Use   Smoking status: Former    Packs/day: 1.00    Years: 16.00    Total pack years: 16.00    Types: Cigarettes    Quit date: 12/10/1988    Years since quitting: 33.5   Smokeless tobacco: Never  Vaping Use   Vaping Use: Never used  Substance Use Topics   Alcohol use: No   Drug use: No   Current Outpatient Medications  Medication Sig Dispense Refill   ascorbic acid (VITAMIN C) 1000 MG tablet Take by mouth.     aspirin 81 MG EC tablet Take 81 mg by mouth daily. Reports taking this medication approximately twice per week.     Calcium-Vitamin D 500-125 MG-UNIT TABS Take 1 tablet by mouth daily.      colchicine 0.6 MG tablet Take 1 tablet (0.6 mg total) by mouth 2 (two) times daily as needed. 60 tablet 5   iron polysaccharides (NU-IRON) 150 MG capsule Take 1 capsule (150 mg total) by mouth daily. 90 capsule 1   Magnesium 400 MG CAPS Take by mouth.     metFORMIN (GLUCOPHAGE) 1000 MG tablet Take 1 tablet (1,000 mg total) by mouth 2 (two) times daily with a meal. 180 tablet 3   Multiple Vitamin (  MULTIVITAMIN) tablet Take 1 tablet by mouth daily.     Olmesartan-amLODIPine-HCTZ 40-10-25 MG TABS Take 1 tablet by mouth every morning. 90 tablet 3   ONETOUCH DELICA LANCETS 51T MISC Use to help check blood sugars twice a day Dx E11.9 300 each 3   pioglitazone (ACTOS) 45 MG tablet TAKE 1 TABLET DAILY 90 tablet 3   rosuvastatin (CRESTOR) 40 MG tablet Take 1 tablet (40 mg total) by mouth daily. 90 tablet 3   No current facility-administered medications for this visit.   No Known Allergies   Review of Systems: All systems reviewed and negative except where noted in HPI.     Physical Exam:    Wt Readings from Last 3 Encounters:  06/13/22 229 lb 2 oz (103.9 kg)  03/26/22 233 lb (105.7 kg)  09/21/21 257 lb (116.6 kg)    BP 118/70   Pulse 66   Ht '5\' 6"'$  (1.676 m)   Wt 229 lb 2 oz (103.9 kg)   BMI 36.98 kg/m  Constitutional:  Pleasant, in no acute  distress. Psychiatric: Normal mood and affect. Behavior is normal. Cardiovascular: Normal rate, regular rhythm. No edema Pulmonary/chest: Effort normal and breath sounds normal. No wheezing, rales or rhonchi. Abdominal: Soft, nondistended, nontender. Bowel sounds active throughout. There are no masses palpable. No hepatomegaly. Neurological: Alert and oriented to person place and time. Skin: Skin is warm and dry. No rashes noted.   ASSESSMENT AND PLAN;   1) Iron Deficiency Anemia  Discussed pathophysiology of IDA at length today, to include GI and non-GI etiolgies. He is o/w w/o overt GI blood loss. No appreciable response to appropriate trial of PO iron supplementation.   - EGD and colonoscopy for diagnostic and potentially therapeutic intent - Resume PO iron for now, and hold 7 days prior to bowel prep - If no clear etiology, will plan for VCE and referral for IV iron   The indications, risks, and benefits of EGD and colonoscopy were explained to the patient in detail. Risks include but are not limited to bleeding, perforation, adverse reaction to medications, and cardiopulmonary compromise. Sequelae include but are not limited to the possibility of surgery, hospitalization, and mortality. The patient verbalized understanding and wished to proceed. All questions answered, referred to scheduler and bowel prep ordered. Further recommendations pending results of the exam.     Lavena Bullion, DO, FACG  06/13/2022, 9:43 AM   Jenny Reichmann Hunt Oris, MD

## 2022-06-13 NOTE — Patient Instructions (Addendum)
If you are age 70 or older, your body mass index should be between 23-30. Your There is no height or weight on file to calculate BMI. If this is out of the aforementioned range listed, please consider follow up with your Primary Care Provider.  __________________________________________________________  The [facility] providers would like to encourage you to use [facility] to communicate with providers for non-urgent requests or questions.  Due to long hold times on the telephone, sending your provider a message by [facility] may be a faster and more efficient way to get a response.  Please allow 48 business hours for a response.  Please remember that this is for non-urgent requests.   Due to recent changes in healthcare laws, you may see the results of your imaging and laboratory studies on MyChart before your provider has had a chance to review them.  We understand that in some cases there may be results that are confusing or concerning to you. Not all laboratory results come back in the same time frame and the provider may be waiting for multiple results in order to interpret others.  Please give [place] 48 hours in order for your provider to thoroughly review all the results before contacting the office for clarification of your results.   You have been scheduled for an endoscopy and colonoscopy. Please follow the written instructions given to you at your visit today. Please pick up your prep supplies at the pharmacy within the next 1-3 days. If you use inhalers (even only as needed), please bring them with you on the day of your procedure.   We have sent the following medications to your pharmacy for you to pick up at your convenience: Clenpiq   Thank you for choosing me and [facility].  Vito Cirigliano, D.O.

## 2022-07-09 ENCOUNTER — Encounter: Payer: Self-pay | Admitting: Gastroenterology

## 2022-07-16 ENCOUNTER — Encounter: Payer: No Typology Code available for payment source | Admitting: Gastroenterology

## 2022-07-18 ENCOUNTER — Ambulatory Visit (AMBULATORY_SURGERY_CENTER): Payer: No Typology Code available for payment source | Admitting: Gastroenterology

## 2022-07-18 ENCOUNTER — Encounter: Payer: Self-pay | Admitting: Gastroenterology

## 2022-07-18 VITALS — BP 115/65 | HR 56 | Temp 96.7°F | Resp 12 | Ht 66.0 in | Wt 229.0 lb

## 2022-07-18 DIAGNOSIS — K2950 Unspecified chronic gastritis without bleeding: Secondary | ICD-10-CM

## 2022-07-18 DIAGNOSIS — D122 Benign neoplasm of ascending colon: Secondary | ICD-10-CM | POA: Diagnosis not present

## 2022-07-18 DIAGNOSIS — K5520 Angiodysplasia of colon without hemorrhage: Secondary | ICD-10-CM | POA: Diagnosis not present

## 2022-07-18 DIAGNOSIS — D509 Iron deficiency anemia, unspecified: Secondary | ICD-10-CM | POA: Diagnosis present

## 2022-07-18 DIAGNOSIS — K649 Unspecified hemorrhoids: Secondary | ICD-10-CM | POA: Diagnosis not present

## 2022-07-18 DIAGNOSIS — K5730 Diverticulosis of large intestine without perforation or abscess without bleeding: Secondary | ICD-10-CM

## 2022-07-18 DIAGNOSIS — K641 Second degree hemorrhoids: Secondary | ICD-10-CM

## 2022-07-18 DIAGNOSIS — K2900 Acute gastritis without bleeding: Secondary | ICD-10-CM

## 2022-07-18 MED ORDER — SODIUM CHLORIDE 0.9 % IV SOLN: 500.0000 mL | Freq: Once | INTRAVENOUS | Status: DC

## 2022-07-18 NOTE — Progress Notes (Signed)
Pt's states no medical or surgical changes since previsit or office visit. 

## 2022-07-18 NOTE — Progress Notes (Signed)
Called to room to assist during endoscopic procedure.  Patient ID and intended procedure confirmed with present staff. Received instructions for my participation in the procedure from the performing physician.  

## 2022-07-18 NOTE — Patient Instructions (Signed)
Read all handouts given to you today   YOU HAD AN ENDOSCOPIC PROCEDURE TODAY AT [facility]:   Refer to the procedure report that was given to you for any specific questions about what was found during the examination.  If the procedure report does not answer your questions, please call your gastroenterologist to clarify.  If you requested that your care partner not be given the details of your procedure findings, then the procedure report has been included in a sealed envelope for you to review at your convenience later.  YOU SHOULD EXPECT: Some feelings of bloating in the abdomen. Passage of more gas than usual.  Walking can help get rid of the air that was put into your GI tract during the procedure and reduce the bloating. If you had a lower endoscopy (such as a colonoscopy or flexible sigmoidoscopy) you may notice spotting of blood in your stool or on the toilet paper. If you underwent a bowel prep for your procedure, you may not have a normal bowel movement for a few days.  Please Note:  You might notice some irritation and congestion in your nose or some drainage.  This is from the oxygen used during your procedure.  There is no need for concern and it should clear up in a day or so.  SYMPTOMS TO REPORT IMMEDIATELY:  Following lower endoscopy (colonoscopy or flexible sigmoidoscopy):  Excessive amounts of blood in the stool  Significant tenderness or worsening of abdominal pains  Swelling of the abdomen that is new, acute  Fever of 100F or higher  Following upper endoscopy (EGD)  Vomiting of blood or coffee ground material  New chest pain or pain under the shoulder blades  Painful or persistently difficult swallowing  New shortness of breath  Fever of 100F or higher  Black, tarry-looking stools  For urgent or emergent issues, a gastroenterologist can be reached at any hour by calling [phone]. Do not use MyChart messaging for urgent concerns.    DIET:  We do  recommend a small meal at first, but then you may proceed to your regular diet.  Drink plenty of fluids but you should avoid alcoholic beverages for 24 hours.  ACTIVITY:  You should plan to take it easy for the rest of today and you should NOT DRIVE or use heavy machinery until tomorrow (because of the sedation medicines used during the test).    FOLLOW UP: Our staff will call the number listed on your records the next business day following your procedure.  We will call around 7:15- 8:00 am to check on you and address any questions or concerns that you may have regarding the information given to you following your procedure. If we do not reach you, we will leave a message.  If you develop any symptoms (ie: fever, flu-like symptoms, shortness of breath, cough etc.) before then, please call [phone].  If you test positive for Covid 19 in the 2 weeks post procedure, please call and report this information to [place].    If any biopsies were taken you will be contacted by phone or by letter within the next 1-3 weeks.  Please call us at [phone] if you have not heard about the biopsies in 3 weeks.    SIGNATURES/CONFIDENTIALITY: You and/or your care partner have signed paperwork which will be entered into your electronic medical record.  These signatures attest to the fact that that the information above on your After Visit Summary has been reviewed  and is understood.  Full responsibility of the confidentiality of this discharge information lies with you and/or your care-partner.

## 2022-07-18 NOTE — Op Note (Signed)
[place] Patient Name: Marcus Rivera Procedure Date: 07/18/2022 1:29 PM MRN: [id number] Endoscopist: Gerrit Heck , MD Age: 71 Referring MD:  Date of Birth: 1951/09/03 Gender: Male Account #: [id number] Procedure:                Colonoscopy Indications:              Iron deficiency anemia Medicines:                Monitored Anesthesia Care Procedure:                Pre-Anesthesia Assessment:                           - Prior to the procedure, a History and Physical                            was performed, and patient medications and                            allergies were reviewed. The patient's tolerance of                            previous anesthesia was also reviewed. The risks                            and benefits of the procedure and the sedation                            options and risks were discussed with the patient.                            All questions were answered, and informed consent                            was obtained. Prior Anticoagulants: The patient has                            taken no previous anticoagulant or antiplatelet                            agents. ASA Grade Assessment: III - A patient with                            severe systemic disease. After reviewing the risks                            and benefits, the patient was deemed in                            satisfactory condition to undergo the procedure.                           After obtaining informed consent, the colonoscope  was passed under direct vision. Throughout the                            procedure, the patient's blood pressure, pulse, and                            oxygen saturations were monitored continuously. The                            CF [id number] #[id number] was introduced through the anus                            and advanced to the the terminal ileum. The                            colonoscopy was performed without  difficulty. The                            patient tolerated the procedure well. The quality                            of the bowel preparation was good. The terminal                            ileum, ileocecal valve, appendiceal orifice, and                            rectum were photographed. Scope In: 1:54:46 PM Scope Out: 2:18:07 PM Scope Withdrawal Time: 0 hours 17 minutes 53 seconds  Total Procedure Duration: 0 hours 23 minutes 21 seconds  Findings:                 The perianal and digital rectal examinations were                            normal.                           Two sessile polyps were found in the ascending                            colon. The polyps were 3 to 5 mm in size. These                            polyps were removed with a cold snare. Resection                            and retrieval were complete. Estimated blood loss                            was minimal.                           Multiple medium-sized angioectasias with typical  arborization were found in the ascending colon and                            in the cecum.                           Multiple small and large-mouthed diverticula were                            found in the sigmoid colon.                           Non-bleeding internal hemorrhoids were found during                            retroflexion. The hemorrhoids were small.                           The terminal ileum appeared normal. Complications:            No immediate complications. Estimated Blood Loss:     Estimated blood loss was minimal. Impression:               - Two 3 to 5 mm polyps in the ascending colon,                            removed with a cold snare. Resected and retrieved.                           - Multiple colonic angioectasias.                           - Diverticulosis in the sigmoid colon.                           - Non-bleeding internal hemorrhoids.                           -  The examined portion of the ileum was normal. Recommendation:           - Patient has a contact number available for                            emergencies. The signs and symptoms of potential                            delayed complications were discussed with the                            patient. Return to normal activities tomorrow.                            Written discharge instructions were provided to the                            patient.                           -  Resume previous diet.                           - Continue present medications.                           - Await pathology results.                           - Repeat colonoscopy for surveillance based on                            pathology results.                           - Repeat CBC and iron panel in 2 months. If                            continued iron deficiency anemia and if pathology                            results from EGD otherwise unrevealing, will plan                            for repeat colonoscopy at [facility] with [facility] for                            treatment of colonic AVMs.                           - Return to GI office at appointment to be                            scheduled. Gerrit Heck, MD 07/18/2022 2:37:51 PM

## 2022-07-18 NOTE — Progress Notes (Signed)
PT taken to PACU. Monitors in place. VSS. Report given to RN. 

## 2022-07-18 NOTE — Op Note (Signed)
[place] Patient Name: Marcus Rivera Procedure Date: 07/18/2022 1:30 PM MRN: [id number] Endoscopist: Gerrit Heck , MD Age: 71 Referring MD:  Date of Birth: 02-Jul-1951 Gender: Male Account #: [id number] Procedure:                Upper GI endoscopy Indications:              Iron deficiency anemia Medicines:                Monitored Anesthesia Care Procedure:                Pre-Anesthesia Assessment:                           - Prior to the procedure, a History and Physical                            was performed, and patient medications and                            allergies were reviewed. The patient's tolerance of                            previous anesthesia was also reviewed. The risks                            and benefits of the procedure and the sedation                            options and risks were discussed with the patient.                            All questions were answered, and informed consent                            was obtained. Prior Anticoagulants: The patient has                            taken no previous anticoagulant or antiplatelet                            agents. ASA Grade Assessment: III - A patient with                            severe systemic disease. After reviewing the risks                            and benefits, the patient was deemed in                            satisfactory condition to undergo the procedure.                           After obtaining informed consent, the endoscope was  passed under direct vision. Throughout the                            procedure, the patient's blood pressure, pulse, and                            oxygen saturations were monitored continuously. The                            Endoscope was introduced through the mouth, and                            advanced to the second part of duodenum. The upper                            GI endoscopy was accomplished  without difficulty.                            The patient tolerated the procedure well. Scope In: Scope Out: Findings:                 The examined esophagus was normal.                           Minimal inflammation characterized by congestion                            (edema) was found in the gastric fundus and in the                            gastric body. Biopsies were taken with a cold                            forceps for Helicobacter pylori testing. Estimated                            blood loss was minimal.                           The examined duodenum was normal. Biopsies were                            taken with a cold forceps for histology. Estimated                            blood loss was minimal. Complications:            No immediate complications. Estimated Blood Loss:     Estimated blood loss was minimal. Impression:               - Normal esophagus.                           - Minimal non-ulcer gastritis. Biopsied.                           -  Normal examined duodenum. Biopsied. Recommendation:           - Patient has a contact number available for                            emergencies. The signs and symptoms of potential                            delayed complications were discussed with the                            patient. Return to normal activities tomorrow.                            Written discharge instructions were provided to the                            patient.                           - Resume previous diet.                           - Continue present medications.                           - Await pathology results.                           - Perform a colonoscopy today. Gerrit Heck, MD 07/18/2022 2:25:43 PM

## 2022-07-18 NOTE — Progress Notes (Signed)
GASTROENTEROLOGY PROCEDURE H&P NOTE   Primary Care Physician: [name], MD    Reason for Procedure:   IDA  Plan:    EGD, colonoscopy  Patient is appropriate for endoscopic procedure(s) in the ambulatory ([place]) setting.  The nature of the procedure, as well as the risks, benefits, and alternatives were carefully and thoroughly reviewed with the patient. Ample time for discussion and questions allowed. The patient understood, was satisfied, and agreed to proceed.     HPI: Marcus Rivera is a 71 y.o. male who presents for EGD and colonoscopy for evaluation of IDA.   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   DIABETES MELLITUS, TYPE II 04/12/2008   GLUCOSE INTOLERANCE 03/12/2008   Gout    HYPERLIPIDEMIA 04/12/2008   HYPERTENSION 03/12/2008   Prostate cancer ([place])    PROSTATE CANCER, HX OF 09/05/2009   PSA, INCREASED 03/18/2008    Past Surgical History:  Procedure Laterality Date   COLONOSCOPY     COLONOSCOPY N/A 11/29/2014   Procedure: COLONOSCOPY;  Surgeon: Inda Castle, MD;  Location: [facility];  Service: Endoscopy;  Laterality: N/A;   ESOPHAGOGASTRODUODENOSCOPY     Late 1980's [facility]   negative stress echo  09/25/2010   [place] SURGERY  12/11/2007    Prior to Admission medications   Medication Sig Start Date End Date Taking? Authorizing Provider  ascorbic acid (VITAMIN C) 1000 MG tablet Take by mouth.   Yes [provider]  Calcium-Vitamin D 500-125 MG-UNIT TABS Take 1 tablet by mouth daily.    Yes [provider]  Magnesium 400 MG CAPS Take by mouth.   Yes [provider]  metFORMIN (GLUCOPHAGE) 1000 MG tablet Take 1 tablet (1,000 mg total) by mouth 2 (two) times daily with a meal. 03/26/22  Yes [name], MD  Olmesartan-amLODIPine-HCTZ 40-10-25 MG TABS Take 1 tablet by mouth every morning. 03/26/22   Yes [name], MD  pioglitazone (ACTOS) 45 MG tablet TAKE 1 TABLET DAILY 03/26/22  Yes [name], MD  rosuvastatin (CRESTOR) 40 MG tablet Take 1 tablet (40 mg total) by mouth daily. 03/26/22  Yes [name], MD  aspirin 81 MG EC tablet Take 81 mg by mouth daily. Reports taking this medication approximately twice per week.    [provider]  colchicine 0.6 MG tablet Take 1 tablet (0.6 mg total) by mouth 2 (two) times daily as needed. 03/17/21   [name], MD  iron polysaccharides (NU-IRON) 150 MG capsule Take 1 capsule (150 mg total) by mouth daily. 03/21/22   [name], MD  Multiple Vitamin (MULTIVITAMIN) tablet Take 1 tablet by mouth daily.    [provider]  Jonetta Speak LANCETS 99I MISC Use to help check blood sugars twice a day Dx E11.9 03/12/16   [name], MD    Current Outpatient Medications  Medication Sig Dispense Refill   ascorbic acid (VITAMIN C) 1000 MG tablet Take by mouth.     Calcium-Vitamin D 500-125 MG-UNIT TABS Take 1 tablet by mouth daily.      Magnesium 400 MG CAPS Take by mouth.     metFORMIN (GLUCOPHAGE) 1000 MG tablet Take 1 tablet (1,000 mg total) by mouth 2 (two) times daily with a meal. 180 tablet 3   Olmesartan-amLODIPine-HCTZ 40-10-25 MG TABS Take 1 tablet by mouth every  morning. 90 tablet 3   pioglitazone (ACTOS) 45 MG tablet TAKE 1 TABLET DAILY 90 tablet 3   rosuvastatin (CRESTOR) 40 MG tablet Take 1 tablet (40 mg total) by mouth daily. 90 tablet 3   aspirin 81 MG EC tablet Take 81 mg by mouth daily. Reports taking this medication approximately twice per week.     colchicine 0.6 MG tablet Take 1 tablet (0.6 mg total) by mouth 2 (two) times daily as needed. 60 tablet 5   iron polysaccharides (NU-IRON) 150 MG capsule Take 1 capsule (150 mg total) by mouth daily. 90 capsule 1   Multiple Vitamin (MULTIVITAMIN) tablet Take 1 tablet by mouth daily.     ONETOUCH DELICA LANCETS 17O MISC Use to help check blood sugars twice a day Dx  E11.9 300 each 3   Current Facility-Administered Medications  Medication Dose Route Frequency Provider Last Rate Last Admin   0.9 %  sodium chloride infusion  500 mL Intravenous Once Jaquelinne Glendening V, DO        Allergies as of 07/18/2022   (No Known Allergies)    Family History  Problem Relation Age of Onset   Cancer Mother        carcinoid tumor 1991,   Liver disease Mother        s/p partial liver resection   Lung cancer Mother    Breast cancer Mother    Hypertension Other    Diabetes Other    Colon cancer Paternal Aunt    Prostate cancer Father    Rectal cancer Neg Hx    Stomach cancer Neg Hx     Social History   Socioeconomic History   Marital status: Married    Spouse name: Not on file   Number of children: 3   Years of education: Not on file   Highest education level: Not on file  Occupational History   Occupation: Copy  Tobacco Use   Smoking status: Former    Packs/day: 1.00    Years: 16.00    Total pack years: 16.00    Types: Cigarettes    Quit date: 12/10/1988    Years since quitting: 33.6   Smokeless tobacco: Never  Vaping Use   Vaping Use: Never used  Substance and Sexual Activity   Alcohol use: No   Drug use: No   Sexual activity: Not Currently  Other Topics Concern   Not on file  Social History Narrative   Not on file   Social Determinants of Health   Financial Resource Strain: Not on file  Food Insecurity: Not on file  Transportation Needs: Not on file  Physical Activity: Not on file  Stress: Not on file  Social Connections: Not on file  Intimate Partner Violence: Not on file    Physical Exam: Vital signs in last 24 hours: '@BP'$  (!) 151/79   Pulse (!) 51   Temp (!) 96.7 F (35.9 C)   Ht '5\' 6"'$  (1.676 m)   Wt 229 lb (103.9 kg)   SpO2 97%   BMI 36.96 kg/m  GEN: NAD EYE: Sclerae anicteric ENT: MMM CV: Non-tachycardic Pulm: CTA b/l GI: Soft, NT/ND NEURO:  Alert & Oriented x  3   Gerrit Heck, DO [facility]   07/18/2022 1:36 PM

## 2022-07-19 ENCOUNTER — Telehealth: Payer: Self-pay

## 2022-07-19 ENCOUNTER — Telehealth: Payer: Self-pay | Admitting: *Deleted

## 2022-07-19 NOTE — Telephone Encounter (Signed)
  Follow up Call-     07/18/2022   12:48 PM  Call back number  Post procedure Call Back phone  # [phone]  Permission to leave phone message Yes     Patient questions:  Do you have a fever, pain , or abdominal swelling? No. Pain Score  0 *  Have you tolerated food without any problems? Yes.    Have you been able to return to your normal activities? Yes.    Do you have any questions about your discharge instructions: Diet   No. Medications  No. Follow up visit  No.  Do you have questions or concerns about your Care? No.  Actions: * If pain score is 4 or above: No action needed, pain <4.

## 2022-07-19 NOTE — Telephone Encounter (Signed)
Pt scheduled for f/u on 08/08/22 at 8:40 am as requested on 8/9 colonoscopy report. Let pt know that he will be contacted in 2 months for blood work. Reminder placed for CBC and iron panel in 2 months.

## 2022-07-27 ENCOUNTER — Other Ambulatory Visit: Payer: Self-pay

## 2022-07-27 DIAGNOSIS — A048 Other specified bacterial intestinal infections: Secondary | ICD-10-CM

## 2022-07-27 MED ORDER — OMEPRAZOLE 20 MG PO CPDR
20.0000 mg | DELAYED_RELEASE_CAPSULE | Freq: Two times a day (BID) | ORAL | 0 refills | Status: AC
Start: 2022-07-27 — End: ?

## 2022-07-27 MED ORDER — BISMUTH 262 MG PO CHEW: 524.0000 mg | CHEWABLE_TABLET | Freq: Four times a day (QID) | ORAL | 0 refills | Status: DC

## 2022-07-27 MED ORDER — METRONIDAZOLE 250 MG PO TABS: 250.0000 mg | ORAL_TABLET | Freq: Four times a day (QID) | ORAL | 0 refills | Status: DC

## 2022-07-27 MED ORDER — DOXYCYCLINE HYCLATE 100 MG PO CAPS: 100.0000 mg | ORAL_CAPSULE | Freq: Two times a day (BID) | ORAL | 0 refills | Status: DC

## 2022-08-08 ENCOUNTER — Ambulatory Visit: Payer: No Typology Code available for payment source | Admitting: Gastroenterology

## 2022-08-08 ENCOUNTER — Encounter: Payer: Self-pay | Admitting: Gastroenterology

## 2022-08-08 VITALS — BP 116/80 | HR 71 | Ht 67.0 in | Wt 226.2 lb

## 2022-08-08 DIAGNOSIS — K5520 Angiodysplasia of colon without hemorrhage: Secondary | ICD-10-CM

## 2022-08-08 DIAGNOSIS — Z86010 Personal history of colonic polyps: Secondary | ICD-10-CM

## 2022-08-08 DIAGNOSIS — D509 Iron deficiency anemia, unspecified: Secondary | ICD-10-CM

## 2022-08-08 DIAGNOSIS — K2970 Gastritis, unspecified, without bleeding: Secondary | ICD-10-CM

## 2022-08-08 DIAGNOSIS — B9681 Helicobacter pylori [H. pylori] as the cause of diseases classified elsewhere: Secondary | ICD-10-CM

## 2022-08-08 NOTE — Progress Notes (Signed)
Chief Complaint:    Iron deficiency anemia, procedure follow-up, H. pylori gastritis follow-up  GI History: Marcus Rivera is a 71 y.o. male with history of diabetes, HTN, HLD, CKD 3, Prostate CA s/p prostatectomy 2009 then radiation 2021, follows in the GI clinic for evaluation of IDA.  No history of overt bleeding.  No history of blood transfusion, previous IV iron.  -09/21/2021: H/H 10.1/31 with MCV/RDW 78.5/17.  Ferritin 13.9, iron 52, TIBC 427, sat 12.2%.  Started on oral iron supplement -03/21/2022: H/H 10.4/31 with MCV/RDW 79/16.5.   Ferritin 14.4, iron 68, TIBC 03/11/2028, sat 15.8%. - 06/13/2022: Initial GI evaluation.  No appreciable response to appropriate trial of oral iron.  No overt bleeding. - 07/18/2022: EGD: Minimal non-ulcer gastritis but biopsies + for H. pylori, otherwise normal.  Normal duodenum with normal biopsies.  Treated with quadruple therapy. - 07/18/2022: Colonoscopy: 2 ascending colon polyps (path: Adenoma x1, HP x1), Multiple medium sized AVMs in the ascending colon and cecum, sigmoid diverticulosis.  Internal hemorrhoids.  Normal TI.  Recommended continued oral iron with repeat CBC check.  If continued IDA, plan for VCE to r/o small bowel AVMs followed by repeat colonoscopy (+/- enteroscopy) at [facility] with ablation of AVMs   Endoscopic History: - EGD about 1988 per patient and normal - Colonoscopy (11/2014): Sigmoid diverticulosis, otherwise normal - 07/18/2022: EGD: Minimal non-ulcer gastritis with biopsies positive for H. pylori. Otherwise normal.  Normal duodenum with normal biopsies.  Treated with quadruple therapy - 07/18/2022: Colonoscopy: 2 ascending colon polyps (path: Adenoma x1, HP x1), Multiple medium sized AVMs in the ascending colon and cecum, sigmoid diverticulosis.  Internal hemorrhoids.  Normal TI.  Repeat colonoscopy in 7 years  HPI:     Patient is a 71 y.o. male presenting to the Gastroenterology Clinic for follow-up.   Currently taking H  pylori treatment without issue. Has 4 days left.   Has had some constipation since the bowel prep/colonoscopy and Abx above.   Taking oral iron and vitamin C as prescribed.  No overt bleeding.  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     aspirin 81 MG EC tablet Take 81 mg by mouth daily. Reports taking this medication approximately twice per week.     Bismuth 262 MG CHEW Chew 524 mg by mouth in the morning, at noon, in the evening, and at bedtime. 112 tablet 0   Calcium-Vitamin D 500-125 MG-UNIT TABS Take 1 tablet by mouth daily.      colchicine 0.6 MG tablet Take 1 tablet (0.6 mg total) by mouth 2 (two) times daily as needed. 60 tablet 5   doxycycline (VIBRAMYCIN) 100 MG capsule Take 1 capsule (100 mg total) by mouth 2 (two) times daily. 28 capsule 0   iron polysaccharides (NU-IRON) 150 MG capsule Take  1 capsule (150 mg total) by mouth daily. 90 capsule 1   Magnesium 400 MG CAPS Take by mouth.     metFORMIN (GLUCOPHAGE) 1000 MG tablet Take 1 tablet (1,000 mg total) by mouth 2 (two) times daily with a meal. 180 tablet 3   metroNIDAZOLE (FLAGYL) 250 MG tablet Take 1 tablet (250 mg total) by mouth 4 (four) times daily. 56 tablet 0   Multiple Vitamin (MULTIVITAMIN) tablet Take 1 tablet by mouth daily.     Olmesartan-amLODIPine-HCTZ 40-10-25 MG TABS Take 1 tablet by  mouth every morning. 90 tablet 3   omeprazole (PRILOSEC) 20 MG capsule Take 1 capsule (20 mg total) by mouth 2 (two) times daily before a meal. 28 capsule 0   ONETOUCH DELICA LANCETS 24M MISC Use to help check blood sugars twice a day Dx E11.9 300 each 3   pioglitazone (ACTOS) 45 MG tablet TAKE 1 TABLET DAILY 90 tablet 3   rosuvastatin (CRESTOR) 40 MG tablet Take 1 tablet (40 mg total) by mouth daily. 90 tablet 3   No current facility-administered medications for this visit.    Physical Exam:     BP 116/80 (BP Location: Left Arm, Patient Position: Sitting, Cuff Size: Normal)   Pulse 71   Ht '5\' 7"'$  (1.702 m)   Wt 226 lb 3.2 oz (102.6 kg)   SpO2 98%   BMI 35.43 kg/m   GENERAL:  Pleasant male in NAD PSYCH: : Cooperative, normal affect Musculoskeletal:  Normal muscle tone, normal strength NEURO: Alert and oriented x 3, no focal neurologic deficits   IMPRESSION and PLAN:    1) Iron deficiency anemia 2) H. pylori gastritis (nonulcer) 3) Colonic AVMs  - Complete quadruple therapy for H. pylori gastritis as prescribed - Check H. pylori stool antigen 4 weeks after completion of therapy to confirm eradication - Check CBC and iron panel in 2 months - Provided appropriate H. pylori eradication, if continued IDA, plan for VCE then colonoscopy (+/- enteroscopy if small bowel pathology on VCE) at [facility] for ablation of AVMs - If appropriate H. pylori eradication and uptrending/normalization of CBC and iron panel, continue with medical management and observation - Patient to call the office after labs to follow-up on results since they may be ordered by [facility] and will directly result to me - Start probiotic and continue 4 weeks beyond completion of antibiotic course - Will coordinate follow-up plan in the office depending on results as above  4) History of colon polyps - Repeat colonoscopy for surveillance in 7 years otherwise  I spent 30 minutes of time, including in depth chart  review, independent review of results as outlined above, communicating results with the patient directly, face-to-face time with the patient, coordinating care, and ordering studies and medications as appropriate, and documentation.           Lavena Bullion ,DO, FACG 08/08/2022, 8:59 AM

## 2022-08-08 NOTE — Patient Instructions (Addendum)
If you are age 70 or older, your body mass index should be between 23-30. Your Body mass index is 35.43 kg/m. If this is out of the aforementioned range listed, please consider follow up with your Primary Care Provider.  __________________________________________________________  The [place] [facility] providers would like to encourage you to use [facility] to communicate with providers for non-urgent requests or questions.  Due to long hold times on the telephone, sending your provider a message by [facility] may be a faster and more efficient way to get a response.  Please allow 48 business hours for a response.  Please remember that this is for non-urgent requests.    Due to recent changes in healthcare laws, you may see the results of your imaging and laboratory studies on MyChart before your provider has had a chance to review them.  We understand that in some cases there may be results that are confusing or concerning to you. Not all laboratory results come back in the same time frame and the provider may be waiting for multiple results in order to interpret others.  Please give [place] 48 hours in order for your provider to thoroughly review all the results before contacting the office for clarification of your results.   Start taking Align. Go to the Lab downstairs at the basement after 4 weeks to get your H - Pylori stool test kit. Send [place] MyChart message few days after you labs done from your Primary care provider.   Thank you for choosing me and [facility].  Vito Cirigliano, D.O.

## 2022-09-11 ENCOUNTER — Telehealth: Payer: Self-pay

## 2022-09-11 NOTE — Telephone Encounter (Signed)
Spoke with pt and let pt know about h. Pylori stool test. Pt verbalized understanding.

## 2022-09-11 NOTE — Telephone Encounter (Signed)
-----   Message from Algernon Huxley, RN sent at 07/27/2022  8:49 AM EDT ----- Regarding: Stool specimen Pt needs to do H pylori stool test, order in epic.

## 2022-09-12 ENCOUNTER — Other Ambulatory Visit: Payer: No Typology Code available for payment source

## 2022-09-17 ENCOUNTER — Other Ambulatory Visit: Payer: No Typology Code available for payment source

## 2022-09-17 DIAGNOSIS — A048 Other specified bacterial intestinal infections: Secondary | ICD-10-CM

## 2022-09-19 ENCOUNTER — Telehealth: Payer: Self-pay

## 2022-09-19 ENCOUNTER — Other Ambulatory Visit: Payer: Self-pay

## 2022-09-19 DIAGNOSIS — D509 Iron deficiency anemia, unspecified: Secondary | ICD-10-CM

## 2022-09-19 LAB — H. PYLORI ANTIGEN, STOOL: H pylori Ag, Stl: NEGATIVE

## 2022-09-19 NOTE — Telephone Encounter (Signed)
Lab orders entered. Spoke with pt and let pt know about labs that are due. Pt stated he has an appointment with his PCP next week and wanted to get labs done all at once. Let pt know he can ask PCP if he will order a CBC and iron panel and once that has resulted I can let Dr. Bryan Lemma know. Pt verbalized understanding.

## 2022-09-19 NOTE — Telephone Encounter (Signed)
-----   Message from Marcus Potter, RN sent at 07/19/2022  2:54 PM EDT ----- Pt needs CBC and iron panel - see 8/9 colonoscopy report. Need to place order.

## 2022-09-24 ENCOUNTER — Other Ambulatory Visit (INDEPENDENT_AMBULATORY_CARE_PROVIDER_SITE_OTHER): Payer: No Typology Code available for payment source

## 2022-09-24 DIAGNOSIS — E559 Vitamin D deficiency, unspecified: Secondary | ICD-10-CM

## 2022-09-24 DIAGNOSIS — E1165 Type 2 diabetes mellitus with hyperglycemia: Secondary | ICD-10-CM | POA: Diagnosis not present

## 2022-09-24 DIAGNOSIS — D509 Iron deficiency anemia, unspecified: Secondary | ICD-10-CM

## 2022-09-24 DIAGNOSIS — E611 Iron deficiency: Secondary | ICD-10-CM | POA: Diagnosis not present

## 2022-09-24 LAB — CBC WITH DIFFERENTIAL/PLATELET
Basophils Absolute: 0 10*3/uL (ref 0.0–0.1)
Basophils Relative: 0.4 % (ref 0.0–3.0)
Eosinophils Absolute: 0.1 10*3/uL (ref 0.0–0.7)
Eosinophils Relative: 3.2 % (ref 0.0–5.0)
HCT: 32.5 % — ABNORMAL LOW (ref 39.0–52.0)
Hemoglobin: 10.5 g/dL — ABNORMAL LOW (ref 13.0–17.0)
Lymphocytes Relative: 31.2 % (ref 12.0–46.0)
Lymphs Abs: 0.6 10*3/uL — ABNORMAL LOW (ref 0.7–4.0)
MCHC: 32.3 g/dL (ref 30.0–36.0)
MCV: 80.4 fl (ref 78.0–100.0)
Monocytes Absolute: 0.3 10*3/uL (ref 0.1–1.0)
Monocytes Relative: 14.1 % — ABNORMAL HIGH (ref 3.0–12.0)
Neutro Abs: 1 10*3/uL — ABNORMAL LOW (ref 1.4–7.7)
Neutrophils Relative %: 51.1 % (ref 43.0–77.0)
Platelets: 212 10*3/uL (ref 150.0–400.0)
RBC: 4.05 Mil/uL — ABNORMAL LOW (ref 4.22–5.81)
RDW: 16.1 % — ABNORMAL HIGH (ref 11.5–15.5)
WBC: 2 10*3/uL — ABNORMAL LOW (ref 4.0–10.5)

## 2022-09-24 LAB — BASIC METABOLIC PANEL
BUN: 20 mg/dL (ref 6–23)
CO2: 30 mEq/L (ref 19–32)
Calcium: 9.5 mg/dL (ref 8.4–10.5)
Chloride: 101 mEq/L (ref 96–112)
Creatinine, Ser: 1.27 mg/dL (ref 0.40–1.50)
GFR: 57.17 mL/min — ABNORMAL LOW (ref >60.00)
Glucose, Bld: 97 mg/dL (ref 70–99)
Potassium: 4.2 mEq/L (ref 3.5–5.1)
Sodium: 138 mEq/L (ref 135–145)

## 2022-09-24 LAB — HEPATIC FUNCTION PANEL
ALT: 12 U/L (ref 0–53)
AST: 17 U/L (ref 0–37)
Albumin: 4.4 g/dL (ref 3.5–5.2)
Alkaline Phosphatase: 42 U/L (ref 39–117)
Bilirubin, Direct: 0.1 mg/dL (ref 0.0–0.3)
Total Bilirubin: 0.5 mg/dL (ref 0.2–1.2)
Total Protein: 6.9 g/dL (ref 6.0–8.3)

## 2022-09-24 LAB — IBC PANEL
Iron: 72 ug/dL (ref 42–165)
Saturation Ratios: 17.7 % — ABNORMAL LOW (ref 20.0–50.0)
TIBC: 407.4 ug/dL (ref 250.0–450.0)
Transferrin: 291 mg/dL (ref 212.0–360.0)

## 2022-09-24 LAB — LIPID PANEL
Cholesterol: 155 mg/dL (ref 0–200)
HDL: 88.9 mg/dL (ref >39.00)
LDL Cholesterol: 58 mg/dL (ref 0–99)
NonHDL: 65.77
Total CHOL/HDL Ratio: 2
Triglycerides: 40 mg/dL (ref 0.0–149.0)
VLDL: 8 mg/dL (ref 0.0–40.0)

## 2022-09-24 LAB — HEMOGLOBIN A1C: Hgb A1c MFr Bld: 6.4 % (ref 4.6–6.5)

## 2022-09-24 LAB — FERRITIN: Ferritin: 15.2 ng/mL — ABNORMAL LOW (ref 22.0–322.0)

## 2022-09-24 LAB — VITAMIN D 25 HYDROXY (VIT D DEFICIENCY, FRACTURES): VITD: 42.01 ng/mL (ref 30.00–100.00)

## 2022-09-26 ENCOUNTER — Telehealth: Payer: Self-pay

## 2022-09-26 ENCOUNTER — Ambulatory Visit: Payer: No Typology Code available for payment source | Admitting: Internal Medicine

## 2022-09-26 VITALS — BP 108/62 | HR 64 | Temp 98.1°F | Ht 67.0 in | Wt 224.0 lb

## 2022-09-26 DIAGNOSIS — E7800 Pure hypercholesterolemia, unspecified: Secondary | ICD-10-CM

## 2022-09-26 DIAGNOSIS — D509 Iron deficiency anemia, unspecified: Secondary | ICD-10-CM

## 2022-09-26 DIAGNOSIS — E1165 Type 2 diabetes mellitus with hyperglycemia: Secondary | ICD-10-CM | POA: Diagnosis not present

## 2022-09-26 DIAGNOSIS — R9431 Abnormal electrocardiogram [ECG] [EKG]: Secondary | ICD-10-CM

## 2022-09-26 DIAGNOSIS — I10 Essential (primary) hypertension: Secondary | ICD-10-CM

## 2022-09-26 DIAGNOSIS — E559 Vitamin D deficiency, unspecified: Secondary | ICD-10-CM

## 2022-09-26 DIAGNOSIS — E538 Deficiency of other specified B group vitamins: Secondary | ICD-10-CM

## 2022-09-26 DIAGNOSIS — Z125 Encounter for screening for malignant neoplasm of prostate: Secondary | ICD-10-CM

## 2022-09-26 MED ORDER — OLMESARTAN-AMLODIPINE-HCTZ 40-10-25 MG PO TABS: 1.0000 | ORAL_TABLET | ORAL | 3 refills | Status: DC

## 2022-09-26 MED ORDER — METFORMIN HCL 1000 MG PO TABS: 1000.0000 mg | ORAL_TABLET | Freq: Two times a day (BID) | ORAL | 3 refills | Status: DC

## 2022-09-26 MED ORDER — ROSUVASTATIN CALCIUM 40 MG PO TABS: 40.0000 mg | ORAL_TABLET | Freq: Every day | ORAL | 3 refills | Status: DC

## 2022-09-26 MED ORDER — POLYSACCHARIDE IRON COMPLEX 150 MG PO CAPS: 150.0000 mg | ORAL_CAPSULE | Freq: Every day | ORAL | 1 refills | Status: DC

## 2022-09-26 MED ORDER — DAPAGLIFLOZIN PROPANEDIOL 10 MG PO TABS: 10.0000 mg | ORAL_TABLET | Freq: Every day | ORAL | 3 refills | Status: DC

## 2022-09-26 NOTE — Progress Notes (Signed)
Patient ID: Marcus Rivera, male   DOB: Jun 01, 1951, 71 y.o.   MRN: [id number]        Chief Complaint: follow up HTN, HLD and DM, low iron       HPI:  Marcus Rivera is a 71 y.o. male here overall doing ok, Pt denies chest pain, increased sob or doe, wheezing, orthopnea, PND, increased LE swelling, palpitations, dizziness or syncope.   Pt denies polydipsia, polyuria, or new focal neuro s/s.    Pt denies fever, wt loss, night sweats, loss of appetite, or other constitutional symptoms  No recent overt bleeding.  Willing for cardiac CT score.        Wt Readings from Last 3 Encounters:  09/26/22 224 lb (101.6 kg)  08/08/22 226 lb 3.2 oz (102.6 kg)  07/18/22 229 lb (103.9 kg)   BP Readings from Last 3 Encounters:  09/26/22 108/62  08/08/22 116/80  07/18/22 115/65         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   DIABETES MELLITUS, TYPE II 04/12/2008   GLUCOSE INTOLERANCE 03/12/2008   Gout    HYPERLIPIDEMIA 04/12/2008   HYPERTENSION 03/12/2008   Prostate cancer ([place])    PROSTATE CANCER, HX OF 09/05/2009   PSA, INCREASED 03/18/2008   Past Surgical History:  Procedure Laterality Date   COLONOSCOPY     COLONOSCOPY N/A 11/29/2014   Procedure: COLONOSCOPY;  Surgeon: Inda Castle, MD;  Location: [facility];  Service: Endoscopy;  Laterality: N/A;   ESOPHAGOGASTRODUODENOSCOPY     Late 1980's [facility]   negative stress echo  09/25/2010   [place] SURGERY  12/11/2007    reports that he quit smoking about 33 years ago. His smoking use included cigarettes. He has a 16.00 pack-year smoking history. He has never used smokeless tobacco. He reports that he does not drink alcohol and does not use drugs. family history includes Breast cancer in his mother; Cancer in his mother; Colon cancer in his paternal aunt; Diabetes in an other family member; Hypertension in an  other family member; Liver disease in his mother; Lung cancer in his mother; Prostate cancer in his father. No Known Allergies Current Outpatient Medications on File Prior to Visit  Medication Sig Dispense Refill   ascorbic acid (VITAMIN C) 1000 MG tablet Take by mouth.     aspirin 81 MG EC tablet Take 81 mg by mouth daily. Reports taking this medication approximately twice per week.     Bismuth 262 MG CHEW Chew 524 mg by mouth in the morning, at noon, in the evening, and at bedtime. 112 tablet 0   Calcium-Vitamin D 500-125 MG-UNIT TABS Take 1 tablet by mouth daily.      colchicine 0.6 MG tablet Take 1 tablet (0.6 mg total) by mouth 2 (two) times daily as needed. 60 tablet 5   doxycycline (VIBRAMYCIN) 100 MG capsule Take 1 capsule (100 mg total) by mouth 2 (two) times daily. 28 capsule 0   Magnesium 400 MG CAPS Take by mouth.     metroNIDAZOLE (FLAGYL) 250 MG tablet Take 1 tablet (250 mg total) by mouth 4 (four) times daily. 56 tablet 0   Multiple Vitamin (MULTIVITAMIN) tablet Take 1 tablet by mouth daily.     omeprazole (PRILOSEC) 20 MG capsule Take 1 capsule (20 mg total) by mouth 2 (two) times daily before a meal. 28  capsule 0   ONETOUCH DELICA LANCETS 51W MISC Use to help check blood sugars twice a day Dx E11.9 300 each 3   pioglitazone (ACTOS) 45 MG tablet TAKE 1 TABLET DAILY 90 tablet 3   No current facility-administered medications on file prior to visit.        ROS:  All others reviewed and negative.  Objective        PE:  BP 108/62 (BP Location: Left Arm, Patient Position: Sitting, Cuff Size: Large)   Pulse 64   Temp 98.1 F (36.7 C) (Oral)   Ht '5\' 7"'$  (1.702 m)   Wt 224 lb (101.6 kg)   SpO2 98%   BMI 35.08 kg/m                 Constitutional: Pt appears in NAD               HENT: Head: NCAT.                Right Ear: External ear normal.                 Left Ear: External ear normal.                Eyes: . Pupils are equal, round, and reactive to light. Conjunctivae and  EOM are normal               Nose: without d/c or deformity               Neck: Neck supple. Gross normal ROM               Cardiovascular: Normal rate and regular rhythm.                 Pulmonary/Chest: Effort normal and breath sounds without rales or wheezing.                Abd:  Soft, NT, ND, + BS, no organomegaly               Neurological: Pt is alert. At baseline orientation, motor grossly intact               Skin: Skin is warm. No rashes, no other new lesions, LE edema - trace bilateral               Psychiatric: Pt behavior is normal without agitation   Micro: none  Cardiac tracings I have personally interpreted today:  none  Pertinent Radiological findings (summarize): none   Lab Results  Component Value Date   WBC 2.0 Repeated and verified X2. (L) 09/24/2022   HGB 10.5 (L) 09/24/2022   HCT 32.5 (L) 09/24/2022   PLT 212.0 09/24/2022   GLUCOSE 97 09/24/2022   CHOL 155 09/24/2022   TRIG 40.0 09/24/2022   HDL 88.90 09/24/2022   LDLDIRECT 139.7 09/05/2009   LDLCALC 58 09/24/2022   ALT 12 09/24/2022   AST 17 09/24/2022   NA 138 09/24/2022   K 4.2 09/24/2022   CL 101 09/24/2022   CREATININE 1.27 09/24/2022   BUN 20 09/24/2022   CO2 30 09/24/2022   TSH 1.81 03/21/2022   PSA 0.00 (L) 03/21/2022   HGBA1C 6.4 09/24/2022   MICROALBUR 6.5 (H) 03/21/2022   Assessment/Plan:  Marcus Rivera is a 71 y.o. Black or African American [2] male with  has a past medical history of ANEMIA-NOS (10/03/2010), Chronic kidney disease (CKD), DIABETES MELLITUS, TYPE II (04/12/2008), GLUCOSE INTOLERANCE (03/12/2008), Gout,  HYPERLIPIDEMIA (04/12/2008), HYPERTENSION (03/12/2008), Prostate cancer [facility]), PROSTATE CANCER, HX OF (09/05/2009), and PSA, INCREASED (03/18/2008).  Iron deficiency anemia Pt tolerating oral, but ferritin remains low, now for iron infusion tx  Hyperlipidemia Lab Results  Component Value Date   LDLCALC 58 09/24/2022   Stable, pt to continue current statin  cresto 40 mg, also for Cardiac CT score   Essential hypertension BP Readings from Last 3 Encounters:  09/26/22 108/62  08/08/22 116/80  07/18/22 115/65   Low normal, asympt, pt to continue medical treatment tribenzor 10-04-39, but also to check at home over the next wk and let us know average BP   Diabetes ([place]) Lab Results  Component Value Date   HGBA1C 6.4 09/24/2022   Stable, but pt to change actos to medical treatment farxiga 10 mg qd, and continue metformin 1000 mg bid  Followup: Return in about 4 months (around 01/27/2023).  Cathlean Cower, MD 09/29/2022 8:39 PM [facility]

## 2022-09-26 NOTE — Telephone Encounter (Signed)
-----   Message from [place], DO sent at 09/26/2022 12:19 PM EDT ----- Labs from 09/24/2022 reviewed and notable for H/H 10.5/33.5 with MCV/RDW 80/16.  Ferritin 15.2, iron 72, TIBC 407, sat 17.7%.  All stable from previous.  Vitamin D 42, normal liver enzymes.  BUN/creatinine 20/1.27 (stable) with normal electrolytes.  Hemoglobin A1c 6.4%.   Not a robust improvement despite oral iron therapy.  I recommend proceeding with VCE now to look for any small bowel pathology.  Continue oral iron in the meantime (hold 7 days prior to VCE).  If VCE normal, recommend continued oral iron with repeat CBC and iron panel in 4 months.  If stable/improving, can consider stopping oral iron and observe labs.  If downtrending, plan for colonoscopy with treatment of known colonic AVMs.   ----- Message ----- From: Marice Potter, RN Sent: 09/26/2022   9:07 AM EDT To: Gerrit Heck V, DO  Pt CBC and iron panel that was due at PCP's office.

## 2022-09-26 NOTE — Telephone Encounter (Signed)
Called pt and gave him results and recommendations. Pt scheduled for VCE on 10/22/22 at 8:30 am. Ambulatory referral placed. Instructions mailed and sent to pt's mychart. Pt verbalized understanding.

## 2022-09-26 NOTE — Patient Instructions (Signed)
Please check your BP at home once daily for 7 days, and let us know if the average is too low for comfort, such as lower than 110/50  Ok to stop the Actos if you are able to start the Farxiga 10 mg per day  You will be contacted regarding the referral for: Iron infusion  You will be contacted regarding the referral for: Cardiac CT Score  Please continue all other medications as before, and refills have been done if requested.  Please have the pharmacy call with any other refills you may need.  Please continue your efforts at being more active, low cholesterol diet, and weight control.  Please keep your appointments with your specialists as you may have planned  Please make an Appointment to return in 4 months, or sooner if needed, also with Lab Appointment for testing done 3-5 days before at the [facility] (so this is for TWO appointments - please see the scheduling desk as you leave)

## 2022-09-26 NOTE — Telephone Encounter (Signed)
-----   Message from Marice Potter, RN sent at 09/19/2022 10:24 AM EDT ----- Pt had CBC and Iron panel at his PCP's office.

## 2022-09-26 NOTE — Telephone Encounter (Signed)
Results for CBC and iron panel routed to Dr. Bryan Lemma.

## 2022-09-27 ENCOUNTER — Telehealth: Payer: Self-pay | Admitting: Pharmacy Technician

## 2022-09-27 NOTE — Telephone Encounter (Signed)
Dr. Jeneen Rinks.  Marcus Rivera is a non preferred medication with [employer] and will likely be denied due to patient has not tried and or failed step therapy. Preferred medication is venofer.  Would you like to try venofer?

## 2022-09-29 ENCOUNTER — Encounter: Payer: Self-pay | Admitting: Internal Medicine

## 2022-09-29 NOTE — Assessment & Plan Note (Addendum)
Lab Results  Component Value Date   HGBA1C 6.4 09/24/2022   Stable, but pt to change actos to medical treatment farxiga 10 mg qd, and continue metformin 1000 mg bid

## 2022-09-29 NOTE — Assessment & Plan Note (Signed)
BP Readings from Last 3 Encounters:  09/26/22 108/62  08/08/22 116/80  07/18/22 115/65   Low normal, asympt, pt to continue medical treatment tribenzor 10-04-39, but also to check at home over the next wk and let us know average BP

## 2022-09-29 NOTE — Assessment & Plan Note (Signed)
Pt tolerating oral, but ferritin remains low, now for iron infusion tx

## 2022-09-29 NOTE — Assessment & Plan Note (Signed)
Lab Results  Component Value Date   LDLCALC 58 09/24/2022   Stable, pt to continue current statin cresto 40 mg, also for Cardiac CT score

## 2022-10-01 ENCOUNTER — Ambulatory Visit (INDEPENDENT_AMBULATORY_CARE_PROVIDER_SITE_OTHER): Payer: No Typology Code available for payment source

## 2022-10-01 VITALS — BP 110/71 | HR 49 | Temp 97.4°F | Resp 16 | Ht 67.0 in | Wt 225.2 lb

## 2022-10-01 DIAGNOSIS — D509 Iron deficiency anemia, unspecified: Secondary | ICD-10-CM | POA: Diagnosis not present

## 2022-10-01 MED ORDER — SODIUM CHLORIDE 0.9 % IV SOLN
200.0000 mg | Freq: Once | INTRAVENOUS | Status: AC
  Administered 2022-10-01: 200 mg via INTRAVENOUS
  Filled 2022-10-01: qty 10

## 2022-10-01 NOTE — Progress Notes (Signed)
Diagnosis: Iron Deficiency Anemia  Provider:  Marshell Garfinkel MD  Procedure: Infusion  IV Type: Peripheral, IV Location: L Antecubital  Venofer (Iron Sucrose), Dose: 200 mg  Infusion Start Time: 4696  Infusion Stop Time: 2952  Post Infusion IV Care: Observation period completed and Peripheral IV Discontinued  Discharge: Condition: Good, Destination: Home . AVS provided to patient.   Performed by:  Adelina Mings, LPN

## 2022-10-03 ENCOUNTER — Ambulatory Visit (INDEPENDENT_AMBULATORY_CARE_PROVIDER_SITE_OTHER): Payer: No Typology Code available for payment source

## 2022-10-03 VITALS — BP 107/68 | HR 61 | Temp 97.5°F | Resp 16 | Ht 67.0 in | Wt 227.6 lb

## 2022-10-03 DIAGNOSIS — D509 Iron deficiency anemia, unspecified: Secondary | ICD-10-CM

## 2022-10-03 MED ORDER — SODIUM CHLORIDE 0.9 % IV SOLN
200.0000 mg | Freq: Once | INTRAVENOUS | Status: AC
  Administered 2022-10-03: 200 mg via INTRAVENOUS
  Filled 2022-10-03: qty 10

## 2022-10-03 NOTE — Progress Notes (Signed)
Diagnosis: Iron Deficiency Anemia  Provider:  Marshell Garfinkel MD  Procedure: Infusion  IV Type: Peripheral, IV Location: R Antecubital  Venofer (Iron Sucrose), Dose: 200 mg  Infusion Start Time: 3435  Infusion Stop Time: 6861  Post Infusion IV Care: Peripheral IV Discontinued  Discharge: Condition: Good, Destination: Home . AVS provided to patient.   Performed by:  Adelina Mings, LPN

## 2022-10-05 ENCOUNTER — Ambulatory Visit (INDEPENDENT_AMBULATORY_CARE_PROVIDER_SITE_OTHER): Payer: No Typology Code available for payment source

## 2022-10-05 VITALS — BP 122/77 | HR 61 | Temp 97.8°F | Resp 18 | Ht 65.0 in | Wt 229.8 lb

## 2022-10-05 DIAGNOSIS — D509 Iron deficiency anemia, unspecified: Secondary | ICD-10-CM

## 2022-10-05 MED ORDER — SODIUM CHLORIDE 0.9 % IV SOLN
200.0000 mg | Freq: Once | INTRAVENOUS | Status: AC
  Administered 2022-10-05: 200 mg via INTRAVENOUS
  Filled 2022-10-05: qty 10

## 2022-10-05 NOTE — Progress Notes (Signed)
Diagnosis: Iron Deficiency Anemia  Provider:  Marshell Garfinkel MD  Procedure: Infusion  IV Type: Peripheral, IV Location: R Antecubital  Venofer (Iron Sucrose), Dose: 200 mg  Infusion Start Time: 8485  Infusion Stop Time: 9276  Post Infusion IV Care: Peripheral IV Discontinued  Discharge: Condition: Good, Destination: Home . AVS provided to patient.   Performed by:  Cleophus Molt, RN

## 2022-10-08 ENCOUNTER — Ambulatory Visit (INDEPENDENT_AMBULATORY_CARE_PROVIDER_SITE_OTHER): Payer: No Typology Code available for payment source

## 2022-10-08 VITALS — BP 102/66 | HR 59 | Temp 97.9°F | Resp 16 | Ht 65.0 in | Wt 227.0 lb

## 2022-10-08 DIAGNOSIS — D509 Iron deficiency anemia, unspecified: Secondary | ICD-10-CM

## 2022-10-08 MED ORDER — SODIUM CHLORIDE 0.9 % IV SOLN
200.0000 mg | Freq: Once | INTRAVENOUS | Status: AC
  Administered 2022-10-08: 200 mg via INTRAVENOUS
  Filled 2022-10-08: qty 10

## 2022-10-08 NOTE — Progress Notes (Signed)
Diagnosis: Iron Deficiency Anemia  Provider:  Marshell Garfinkel MD  Procedure: Infusion  IV Type: Peripheral, IV Location: R Antecubital  Venofer (Iron Sucrose), Dose: 200 mg  Infusion Start Time: 8016  Infusion Stop Time: 1520  Post Infusion IV Care: Peripheral IV Discontinued  Discharge: Condition: Good, Destination: Home . AVS provided to patient.   Performed by:  Koren Shiver, RN

## 2022-10-10 ENCOUNTER — Ambulatory Visit (INDEPENDENT_AMBULATORY_CARE_PROVIDER_SITE_OTHER): Payer: No Typology Code available for payment source

## 2022-10-10 VITALS — BP 112/68 | HR 60 | Temp 97.4°F | Resp 16 | Ht 67.0 in | Wt 233.0 lb

## 2022-10-10 DIAGNOSIS — D509 Iron deficiency anemia, unspecified: Secondary | ICD-10-CM | POA: Diagnosis not present

## 2022-10-10 MED ORDER — SODIUM CHLORIDE 0.9 % IV SOLN
200.0000 mg | Freq: Once | INTRAVENOUS | Status: AC
  Administered 2022-10-10: 200 mg via INTRAVENOUS
  Filled 2022-10-10: qty 10

## 2022-10-10 NOTE — Progress Notes (Signed)
Diagnosis: Iron Deficiency Anemia  Provider:  Marshell Garfinkel MD  Procedure: Infusion  IV Type: Peripheral, IV Location: R Antecubital  Venofer (Iron Sucrose), Dose: 200 mg  Infusion Start Time: 7034  Infusion Stop Time: 0352  Post Infusion IV Care: Peripheral IV Discontinued  Discharge: Condition: Good, Destination: Home . AVS provided to patient.   Performed by:  Koren Shiver, RN

## 2022-10-17 NOTE — Telephone Encounter (Signed)
Patient called regarding his capsule endo upcoming up said he was suppose to stop his iron supplement but hasn't not sure if that will be ok to proceed. Please advise.

## 2022-10-17 NOTE — Telephone Encounter (Signed)
Capsule endoscopy rescheduled to 10/26/22. Pt verbalized understanding.

## 2022-10-26 ENCOUNTER — Ambulatory Visit: Payer: No Typology Code available for payment source | Admitting: Gastroenterology

## 2022-10-26 DIAGNOSIS — K5520 Angiodysplasia of colon without hemorrhage: Secondary | ICD-10-CM

## 2022-10-26 DIAGNOSIS — D500 Iron deficiency anemia secondary to blood loss (chronic): Secondary | ICD-10-CM

## 2022-10-26 NOTE — Progress Notes (Signed)
SN: [id number] Exp: 2023-12-15 LOT: [id number] Patient arrived for Capsule Endoscopy. Reported the prep went well. This nurse explained dietary restrictions for the next few hours. Patient verbalized understanding. Opened capsule, ensured capsule was flashing prior to the patient swallowing the capsule. Patient swallowed capsule without difficulty. Patient instructed to return to the office at 4:00 pm today for removal of the recording equipment, to call the office with any questions and if no capsule was visualized after 72 hours. No further questions by the conclusion of the visit.    Addendum: Video Capsule Endoscopy report  Findings: 1) Complete capsule study with adequate prep 2) 2-3 small, nonbleeding AVMs located in the proximal small bowel at 2 hours 11 minutes - 2 hours 13-minute mark.  This is approximately 20 minutes beyond the first duodenal image. 3) The remainder of the small bowel is otherwise normal appearing  Recommendations: - Continue surveillance of CBC and iron studies.  Plan for repeat CBC and iron panel in February - If any recurrence of anemia, plan for enteroscopy and colonoscopy at 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

## 2022-10-26 NOTE — Patient Instructions (Signed)

## 2022-12-19 ENCOUNTER — Encounter: Payer: Self-pay | Admitting: Gastroenterology

## 2022-12-20 ENCOUNTER — Telehealth: Payer: Self-pay

## 2022-12-20 NOTE — Telephone Encounter (Signed)
-----   Message from [place], DO sent at 12/14/2022  4:26 PM EST ----- Please contact this patient to let him know that we reviewed the results of the capsule endoscopy which are notable for the following:  Findings: 1) Complete capsule study with adequate prep 2) 2-3 small, nonbleeding AVMs located in the proximal small bowel at 2 hours 11 minutes - 2 hours 13-minute mark.  This is approximately 20 minutes beyond the first duodenal image. 3) The remainder of the small bowel is otherwise normal appearing  Recommendations: - Continue surveillance of CBC and iron studies.  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

## 2022-12-20 NOTE — Telephone Encounter (Signed)
Spoke with pt and gave pt results and recommendations. Pt verbalized understanding and stated that he has a follow up appointment with his primary care where he will get blood work done in February. Reminder placed for labs.

## 2022-12-27 LAB — HM DIABETES EYE EXAM

## 2022-12-28 ENCOUNTER — Encounter: Payer: Self-pay | Admitting: Internal Medicine

## 2023-01-22 ENCOUNTER — Other Ambulatory Visit (INDEPENDENT_AMBULATORY_CARE_PROVIDER_SITE_OTHER): Payer: No Typology Code available for payment source

## 2023-01-22 DIAGNOSIS — Z125 Encounter for screening for malignant neoplasm of prostate: Secondary | ICD-10-CM

## 2023-01-22 DIAGNOSIS — D509 Iron deficiency anemia, unspecified: Secondary | ICD-10-CM

## 2023-01-22 DIAGNOSIS — E559 Vitamin D deficiency, unspecified: Secondary | ICD-10-CM

## 2023-01-22 DIAGNOSIS — E1165 Type 2 diabetes mellitus with hyperglycemia: Secondary | ICD-10-CM

## 2023-01-22 DIAGNOSIS — E538 Deficiency of other specified B group vitamins: Secondary | ICD-10-CM | POA: Diagnosis not present

## 2023-01-22 LAB — BASIC METABOLIC PANEL
BUN: 23 mg/dL (ref 6–23)
CO2: 31 mEq/L (ref 19–32)
Calcium: 9.3 mg/dL (ref 8.4–10.5)
Chloride: 99 mEq/L (ref 96–112)
Creatinine, Ser: 1.44 mg/dL (ref 0.40–1.50)
GFR: 49.05 mL/min — ABNORMAL LOW (ref >60.00)
Glucose, Bld: 83 mg/dL (ref 70–99)
Potassium: 3.9 mEq/L (ref 3.5–5.1)
Sodium: 138 mEq/L (ref 135–145)

## 2023-01-22 LAB — MICROALBUMIN / CREATININE URINE RATIO
Creatinine,U: 72.2 mg/dL
Microalb Creat Ratio: 1.7 mg/g (ref 0.0–30.0)
Microalb, Ur: 1.3 mg/dL (ref 0.0–1.9)

## 2023-01-22 LAB — HEPATIC FUNCTION PANEL
ALT: 18 U/L (ref 0–53)
AST: 17 U/L (ref 0–37)
Albumin: 4.3 g/dL (ref 3.5–5.2)
Alkaline Phosphatase: 48 U/L (ref 39–117)
Bilirubin, Direct: 0.1 mg/dL (ref 0.0–0.3)
Total Bilirubin: 0.5 mg/dL (ref 0.2–1.2)
Total Protein: 6.8 g/dL (ref 6.0–8.3)

## 2023-01-22 LAB — CBC WITH DIFFERENTIAL/PLATELET
Basophils Absolute: 0 10*3/uL (ref 0.0–0.1)
Basophils Relative: 0.7 % (ref 0.0–3.0)
Eosinophils Absolute: 0 10*3/uL (ref 0.0–0.7)
Eosinophils Relative: 1.5 % (ref 0.0–5.0)
HCT: 37.4 % — ABNORMAL LOW (ref 39.0–52.0)
Hemoglobin: 12.2 g/dL — ABNORMAL LOW (ref 13.0–17.0)
Lymphocytes Relative: 16.8 % (ref 12.0–46.0)
Lymphs Abs: 0.4 10*3/uL — ABNORMAL LOW (ref 0.7–4.0)
MCHC: 32.6 g/dL (ref 30.0–36.0)
MCV: 81.2 fl (ref 78.0–100.0)
Monocytes Absolute: 0.3 10*3/uL (ref 0.1–1.0)
Monocytes Relative: 11 % (ref 3.0–12.0)
Neutro Abs: 1.8 10*3/uL (ref 1.4–7.7)
Neutrophils Relative %: 70 % (ref 43.0–77.0)
Platelets: 195 10*3/uL (ref 150.0–400.0)
RBC: 4.61 Mil/uL (ref 4.22–5.81)
RDW: 14.5 % (ref 11.5–15.5)
WBC: 2.6 10*3/uL — ABNORMAL LOW (ref 4.0–10.5)

## 2023-01-22 LAB — LIPID PANEL
Cholesterol: 158 mg/dL (ref 0–200)
HDL: 90.4 mg/dL (ref >39.00)
LDL Cholesterol: 57 mg/dL (ref 0–99)
NonHDL: 67.72
Total CHOL/HDL Ratio: 2
Triglycerides: 52 mg/dL (ref 0.0–149.0)
VLDL: 10.4 mg/dL (ref 0.0–40.0)

## 2023-01-22 LAB — URINALYSIS, ROUTINE W REFLEX MICROSCOPIC
Bilirubin Urine: NEGATIVE
Hgb urine dipstick: NEGATIVE
Ketones, ur: NEGATIVE
Leukocytes,Ua: NEGATIVE
Nitrite: NEGATIVE
RBC / HPF: NONE SEEN (ref 0–?)
Specific Gravity, Urine: 1.02 (ref 1.000–1.030)
Total Protein, Urine: NEGATIVE
Urine Glucose: >=1000 — AB
Urobilinogen, UA: 0.2 (ref 0.0–1.0)
WBC, UA: NONE SEEN (ref 0–?)
pH: 6 (ref 5.0–8.0)

## 2023-01-22 LAB — IBC + FERRITIN
Ferritin: 83.8 ng/mL (ref 22.0–322.0)
Iron: 60 ug/dL (ref 42–165)
Saturation Ratios: 18.8 % — ABNORMAL LOW (ref 20.0–50.0)
TIBC: 319.2 ug/dL (ref 250.0–450.0)
Transferrin: 228 mg/dL (ref 212.0–360.0)

## 2023-01-22 LAB — VITAMIN B12: Vitamin B-12: 398 pg/mL (ref 211–911)

## 2023-01-22 LAB — HEMOGLOBIN A1C: Hgb A1c MFr Bld: 6.2 % (ref 4.6–6.5)

## 2023-01-22 LAB — VITAMIN D 25 HYDROXY (VIT D DEFICIENCY, FRACTURES): VITD: 43.57 ng/mL (ref 30.00–100.00)

## 2023-01-22 LAB — PSA: PSA: 0 ng/mL — ABNORMAL LOW (ref 0.10–4.00)

## 2023-01-22 LAB — TSH: TSH: 0.87 u[IU]/mL (ref 0.35–5.50)

## 2023-01-29 ENCOUNTER — Ambulatory Visit: Payer: No Typology Code available for payment source | Admitting: Internal Medicine

## 2023-01-29 VITALS — BP 130/80 | HR 70 | Temp 98.2°F | Ht 67.0 in | Wt 232.0 lb

## 2023-01-29 DIAGNOSIS — E1165 Type 2 diabetes mellitus with hyperglycemia: Secondary | ICD-10-CM | POA: Diagnosis not present

## 2023-01-29 DIAGNOSIS — I10 Essential (primary) hypertension: Secondary | ICD-10-CM

## 2023-01-29 DIAGNOSIS — Z23 Encounter for immunization: Secondary | ICD-10-CM | POA: Diagnosis not present

## 2023-01-29 DIAGNOSIS — Z0001 Encounter for general adult medical examination with abnormal findings: Secondary | ICD-10-CM | POA: Diagnosis not present

## 2023-01-29 DIAGNOSIS — E7800 Pure hypercholesterolemia, unspecified: Secondary | ICD-10-CM | POA: Diagnosis not present

## 2023-01-29 DIAGNOSIS — R9431 Abnormal electrocardiogram [ECG] [EKG]: Secondary | ICD-10-CM

## 2023-01-29 DIAGNOSIS — N1831 Chronic kidney disease, stage 3a: Secondary | ICD-10-CM

## 2023-01-29 DIAGNOSIS — E559 Vitamin D deficiency, unspecified: Secondary | ICD-10-CM

## 2023-01-29 NOTE — Patient Instructions (Addendum)
You had the flu shot today. And the Shingles shot #2 today  Please continue all other medications as before, and refills have been done if requested.  Please have the pharmacy call with any other refills you may need.  Please continue your efforts at being more active, low cholesterol diet, and weight control.  You are otherwise up to date with prevention measures today.  Please keep your appointments with your specialists as you may have planned  You will be contacted regarding the referral for: Cardiac CT score  Please make an Appointment to return in 6 months, or sooner if needed, also with Lab Appointment for testing done 3-5 days before at the [facility] (so this is for TWO appointments - please see the scheduling desk as you leave)

## 2023-01-29 NOTE — Progress Notes (Signed)
Patient ID: Marcus Rivera, male   DOB: [birth year], 72 y.o.   MRN: [id number]         Chief Complaint:: wellness exam and dm, htn, hld, ckd3a       HPI:  Marcus Rivera is a 72 y.o. male here for wellness exam; for flu shot and shingrix #2 today; o/w up to date                        Also BP at home most often < 130/80.  Due to f/u urology with psa in fall 2024.  Pt denies chest pain, increased sob or doe, wheezing, orthopnea, PND, increased LE swelling, palpitations, dizziness or syncope.   Pt denies polydipsia, polyuria, or new focal neuro s/s.    Pt denies fever, wt loss, night sweats, loss of appetite, or other constitutional symptoms    Wt Readings from Last 3 Encounters:  01/29/23 232 lb (105.2 kg)  10/10/22 233 lb (105.7 kg)  10/08/22 227 lb (103 kg)   BP Readings from Last 3 Encounters:  01/29/23 130/80  10/10/22 112/68  10/08/22 102/66   Immunization History  Administered Date(s) Administered   Fluad Quad(high Dose 65+) 09/11/2019, 09/16/2020, 09/21/2021, 01/29/2023   Influenza Whole 09/26/2010   Influenza, High Dose Seasonal PF 09/06/2017   Moderna Sars-Covid-2 Vaccination 02/18/2020, 03/16/2020, 10/10/2020   Pneumococcal Conjugate-13 03/05/2017   Pneumococcal Polysaccharide-23 09/10/2018   Tdap 09/04/2016   Zoster Recombinat (Shingrix) 10/06/2021, 01/29/2023  There are no preventive care reminders to display for this patient.    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   DIABETES MELLITUS, TYPE II 04/12/2008   GLUCOSE INTOLERANCE 03/12/2008   Gout    HYPERLIPIDEMIA 04/12/2008   HYPERTENSION 03/12/2008   Prostate cancer ([place])    PROSTATE CANCER, HX OF 09/05/2009   PSA, INCREASED 03/18/2008   Past Surgical History:  Procedure Laterality Date   COLONOSCOPY     COLONOSCOPY N/A 11/29/2014   Procedure: COLONOSCOPY;  Surgeon: Inda Castle, MD;  Location: [facility];   Service: Endoscopy;  Laterality: N/A;   ESOPHAGOGASTRODUODENOSCOPY     Late 1980's [facility]   negative stress echo  09/25/2010   [place] SURGERY  12/11/2007    reports that he quit smoking about 34 years ago. His smoking use included cigarettes. He has a 16.00 pack-year smoking history. He has never used smokeless tobacco. He reports that he does not drink alcohol and does not use drugs. family history includes Breast cancer in his mother; Cancer in his mother; Colon cancer in his paternal aunt; Diabetes in an other family member; Hypertension in an other family member; Liver disease in his mother; Lung cancer in his mother; Prostate cancer in his father. No Known Allergies Current Outpatient Medications on File Prior to Visit  Medication Sig Dispense Refill   ascorbic acid (VITAMIN C) 1000 MG tablet Take by mouth.     aspirin 81 MG EC tablet Take 81 mg by mouth daily. Reports taking this medication approximately twice per week.     Bismuth 262 MG CHEW Chew 524 mg by mouth in the morning, at noon, in the evening, and at bedtime. 112 tablet 0   Calcium-Vitamin D 500-125 MG-UNIT TABS Take 1 tablet by mouth daily.      colchicine 0.6 MG tablet Take 1 tablet (0.6 mg total)  by mouth 2 (two) times daily as needed. 60 tablet 5   dapagliflozin propanediol (FARXIGA) 10 MG TABS tablet Take 1 tablet (10 mg total) by mouth daily before breakfast. 90 tablet 3   iron polysaccharides (NU-IRON) 150 MG capsule Take 1 capsule (150 mg total) by mouth daily. 90 capsule 1   Magnesium 400 MG CAPS Take by mouth.     metFORMIN (GLUCOPHAGE) 1000 MG tablet Take 1 tablet (1,000 mg total) by mouth 2 (two) times daily with a meal. 180 tablet 3   Multiple Vitamin (MULTIVITAMIN) tablet Take 1 tablet by mouth daily.     Olmesartan-amLODIPine-HCTZ 40-10-25 MG TABS Take 1 tablet by mouth every morning. 90 tablet 3   omeprazole (PRILOSEC) 20 MG capsule Take 1 capsule (20 mg total) by mouth 2  (two) times daily before a meal. 28 capsule 0   ONETOUCH DELICA LANCETS 99991111 MISC Use to help check blood sugars twice a day Dx E11.9 300 each 3   rosuvastatin (CRESTOR) 40 MG tablet Take 1 tablet (40 mg total) by mouth daily. 90 tablet 3   No current facility-administered medications on file prior to visit.        ROS:  All others reviewed and negative.  Objective        PE:  BP 130/80 (BP Location: Left Arm, Patient Position: Sitting, Cuff Size: Large)   Pulse 70   Temp 98.2 F (36.8 C) (Oral)   Ht '5\' 7"'$  (1.702 m)   Wt 232 lb (105.2 kg)   SpO2 94%   BMI 36.34 kg/m                 Constitutional: Pt appears in NAD               HENT: Head: NCAT.                Right Ear: External ear normal.                 Left Ear: External ear normal.                Eyes: . Pupils are equal, round, and reactive to light. Conjunctivae and EOM are normal               Nose: without d/c or deformity               Neck: Neck supple. Gross normal ROM               Cardiovascular: Normal rate and regular rhythm.                 Pulmonary/Chest: Effort normal and breath sounds without rales or wheezing.                Abd:  Soft, NT, ND, + BS, no organomegaly               Neurological: Pt is alert. At baseline orientation, motor grossly intact               Skin: Skin is warm. No rashes, no other new lesions, LE edema - none               Psychiatric: Pt behavior is normal without agitation   Micro: none  Cardiac tracings I have personally interpreted today:  none  Pertinent Radiological findings (summarize): none   Lab Results  Component Value Date   WBC 2.6 (L) 01/22/2023   HGB 12.2 (L) 01/22/2023  HCT 37.4 (L) 01/22/2023   PLT 195.0 01/22/2023   GLUCOSE 83 01/22/2023   CHOL 158 01/22/2023   TRIG 52.0 01/22/2023   HDL 90.40 01/22/2023   LDLDIRECT 139.7 09/05/2009   LDLCALC 57 01/22/2023   ALT 18 01/22/2023   AST 17 01/22/2023   NA 138 01/22/2023   K 3.9 01/22/2023   CL 99  01/22/2023   CREATININE 1.44 01/22/2023   BUN 23 01/22/2023   CO2 31 01/22/2023   TSH 0.87 01/22/2023   PSA 0.00 (L) 01/22/2023   HGBA1C 6.2 01/22/2023   MICROALBUR 1.3 01/22/2023   Assessment/Plan:  Marcus Rivera is a 72 y.o. Black or African American [2] male with  has a past medical history of ANEMIA-NOS (10/03/2010), Chronic kidney disease (CKD), DIABETES MELLITUS, TYPE II (04/12/2008), GLUCOSE INTOLERANCE (03/12/2008), Gout, HYPERLIPIDEMIA (04/12/2008), HYPERTENSION (03/12/2008), Prostate cancer ([place]), PROSTATE CANCER, HX OF (09/05/2009), and PSA, INCREASED (03/18/2008).  Encounter for well adult exam with abnormal findings Age and sex appropriate education and counseling updated with regular exercise and diet Referrals for preventative services - none needed Immunizations addressed - for flu shot and shingrix #2 today Smoking counseling  - none needed Evidence for depression or other mood disorder - none significant Most recent labs reviewed. I have personally reviewed and have noted: 1) the patient's medical and social history 2) The patient's current medications and supplements 3) The patient's height, weight, and BMI have been recorded in the chart   CKD (chronic kidney disease) stage 3, GFR 30-59 ml/min (HCC) Lab Results  Component Value Date   CREATININE 1.44 01/22/2023   Stable overall, cont to avoid nephrotoxins   Diabetes ([place]) Lab Results  Component Value Date   HGBA1C 6.2 01/22/2023   Stable, pt to continue current medical treatment farxiga 10 mg qd, metformine 1000 bid, also fro Card CT score, also consider mounjaro for wt loss   Essential hypertension BP Readings from Last 3 Encounters:  01/29/23 130/80  10/10/22 112/68  10/08/22 102/66   Stable, pt to continue medical treatment tribenzor 40-10-25 mg qd   Hyperlipidemia Lab Results  Component Value Date   LDLCALC 57 01/22/2023   Stable, pt to continue current statin crestor 40 mg  qd  Followup: Return in about 6 months (around 07/30/2023).  Cathlean Cower, MD 02/02/2023 10:08 PM [facility]

## 2023-01-30 ENCOUNTER — Other Ambulatory Visit: Payer: Self-pay

## 2023-01-30 DIAGNOSIS — D500 Iron deficiency anemia secondary to blood loss (chronic): Secondary | ICD-10-CM

## 2023-02-02 ENCOUNTER — Encounter: Payer: Self-pay | Admitting: Internal Medicine

## 2023-02-02 NOTE — Assessment & Plan Note (Signed)
Lab Results  Component Value Date   CREATININE 1.44 01/22/2023   Stable overall, cont to avoid nephrotoxins

## 2023-02-02 NOTE — Assessment & Plan Note (Signed)
Lab Results  Component Value Date   HGBA1C 6.2 01/22/2023   Stable, pt to continue current medical treatment farxiga 10 mg qd, metformine 1000 bid, also fro Card CT score, also consider mounjaro for wt loss

## 2023-02-02 NOTE — Assessment & Plan Note (Signed)
Age and sex appropriate education and counseling updated with regular exercise and diet Referrals for preventative services - none needed Immunizations addressed - for flu shot and shingrix #2 today Smoking counseling  - none needed Evidence for depression or other mood disorder - none significant Most recent labs reviewed. I have personally reviewed and have noted: 1) the patient's medical and social history 2) The patient's current medications and supplements 3) The patient's height, weight, and BMI have been recorded in the chart

## 2023-02-02 NOTE — Assessment & Plan Note (Signed)
Lab Results  Component Value Date   LDLCALC 57 01/22/2023   Stable, pt to continue current statin crestor 40 mg qd

## 2023-02-02 NOTE — Assessment & Plan Note (Signed)
BP Readings from Last 3 Encounters:  01/29/23 130/80  10/10/22 112/68  10/08/22 102/66   Stable, pt to continue medical treatment tribenzor 40-10-25 mg qd

## 2023-02-11 ENCOUNTER — Ambulatory Visit (HOSPITAL_COMMUNITY): Payer: No Typology Code available for payment source

## 2023-05-20 ENCOUNTER — Encounter: Payer: Self-pay | Admitting: Family Medicine

## 2023-05-20 ENCOUNTER — Ambulatory Visit: Payer: No Typology Code available for payment source | Admitting: Family Medicine

## 2023-05-20 VITALS — BP 90/58 | HR 88 | Temp 97.6°F | Resp 20 | Ht 67.0 in | Wt 224.0 lb

## 2023-05-20 DIAGNOSIS — J069 Acute upper respiratory infection, unspecified: Secondary | ICD-10-CM

## 2023-05-20 DIAGNOSIS — R000 Tachycardia, unspecified: Secondary | ICD-10-CM | POA: Diagnosis not present

## 2023-05-20 DIAGNOSIS — N1832 Chronic kidney disease, stage 3b: Secondary | ICD-10-CM

## 2023-05-20 LAB — COMPREHENSIVE METABOLIC PANEL
ALT: 22 U/L (ref 0–53)
AST: 25 U/L (ref 0–37)
Albumin: 4.4 g/dL (ref 3.5–5.2)
Alkaline Phosphatase: 40 U/L (ref 39–117)
BUN: 32 mg/dL — ABNORMAL HIGH (ref 6–23)
CO2: 28 mEq/L (ref 19–32)
Calcium: 9.4 mg/dL (ref 8.4–10.5)
Chloride: 96 mEq/L (ref 96–112)
Creatinine, Ser: 1.96 mg/dL — ABNORMAL HIGH (ref 0.40–1.50)
GFR: 33.81 mL/min — ABNORMAL LOW (ref >60.00)
Glucose, Bld: 91 mg/dL (ref 70–99)
Potassium: 3.9 mEq/L (ref 3.5–5.1)
Sodium: 137 mEq/L (ref 135–145)
Total Bilirubin: 0.6 mg/dL (ref 0.2–1.2)
Total Protein: 7.5 g/dL (ref 6.0–8.3)

## 2023-05-20 LAB — MAGNESIUM: Magnesium: 2 mg/dL (ref 1.5–2.5)

## 2023-05-20 LAB — TSH: TSH: 1.07 u[IU]/mL (ref 0.35–5.50)

## 2023-05-20 NOTE — Progress Notes (Signed)
Assessment & Plan:  1. Viral URI Education provided on viral URIs.  Symptom management.  2. Tachycardia Education provided on tachycardia.  Suggested he decrease the amount of caffeine he is drinking per day.  Advised he needs to seek treatment if he notices an irregularity in his rhythm.  Encouraged to monitor blood pressure and heart rate periodically at home. - Comprehensive metabolic panel - TSH - Magnesium  No results found for any visits on 05/20/23.  Follow up plan: Return for as scheduled with PCP.  Deliah Boston, MSN, APRN, FNP-C  Subjective:  HPI: Marcus Rivera is a 72 y.o. male presenting on 05/20/2023 for Allergic Rhinitis  (Sinus drainage, scratchy throat and slight cough for 2-3 days. ) and elevated Heart rate (Pt has noticed in last few weeks that pulse is now running high around 100, rather than his typical around 70-80./)  Patient complains of cough, head congestion, postnasal drainage, and scratchy throat . He denies fever and shortness of breath. Onset of symptoms was 3 days ago, gradually improving since that time. He is drinking plenty of fluids. Evaluation to date: none. Treatment to date: none. He does not smoke.   Patient is also concerned about an elevated heart rate.  He has noticed over the past few weeks that his heart rate is running around 100 rather than his typical 70-80.  He feels it is a constant elevation.  Denies any skipping or irregular beats, dizziness, shortness of breath, and chest pain.  He does drink a lots of coffee, 64 ounces per day.   ROS: Negative unless specifically indicated above in HPI.   Relevant past medical history reviewed and updated as indicated.   Allergies and medications reviewed and updated.   Current Outpatient Medications:    cholecalciferol (VITAMIN D3) 25 MCG (1000 UNIT) tablet, Take 1,000 Units by mouth daily., Disp: , Rfl:    colchicine 0.6 MG tablet, Take 1 tablet (0.6 mg total) by mouth 2 (two) times  daily as needed., Disp: 60 tablet, Rfl: 5   dapagliflozin propanediol (FARXIGA) 10 MG TABS tablet, Take 1 tablet (10 mg total) by mouth daily before breakfast., Disp: 90 tablet, Rfl: 3   iron polysaccharides (NU-IRON) 150 MG capsule, Take 1 capsule (150 mg total) by mouth daily., Disp: 90 capsule, Rfl: 1   Magnesium 400 MG CAPS, Take by mouth., Disp: , Rfl:    metFORMIN (GLUCOPHAGE) 1000 MG tablet, Take 1 tablet (1,000 mg total) by mouth 2 (two) times daily with a meal., Disp: 180 tablet, Rfl: 3   Multiple Vitamin (MULTIVITAMIN) tablet, Take 1 tablet by mouth daily., Disp: , Rfl:    Olmesartan-amLODIPine-HCTZ 40-10-25 MG TABS, Take 1 tablet by mouth every morning., Disp: 90 tablet, Rfl: 3   ONETOUCH DELICA LANCETS 33G MISC, Use to help check blood sugars twice a day Dx E11.9, Disp: 300 each, Rfl: 3   rosuvastatin (CRESTOR) 40 MG tablet, Take 1 tablet (40 mg total) by mouth daily., Disp: 90 tablet, Rfl: 3   omeprazole (PRILOSEC) 20 MG capsule, Take 1 capsule (20 mg total) by mouth 2 (two) times daily before a meal. (Patient not taking: Reported on 05/20/2023), Disp: 28 capsule, Rfl: 0  No Known Allergies  Objective:   BP (!) 90/58 Comment: right arm - manual  Pulse 88   Temp 97.6 F (36.4 C)   Resp 20   Ht 5\' 7"  (1.702 m)   Wt 224 lb (101.6 kg)   SpO2 95%   BMI 35.08 kg/m  Physical Exam Vitals reviewed.  Constitutional:      General: He is not in acute distress.    Appearance: Normal appearance. He is not ill-appearing, toxic-appearing or diaphoretic.  HENT:     Head: Normocephalic and atraumatic.     Right Ear: Tympanic membrane, ear canal and external ear normal. There is no impacted cerumen.     Left Ear: Tympanic membrane, ear canal and external ear normal. There is no impacted cerumen.     Nose: Nose normal.     Right Sinus: No maxillary sinus tenderness or frontal sinus tenderness.     Left Sinus: No maxillary sinus tenderness or frontal sinus tenderness.     Mouth/Throat:      Mouth: Mucous membranes are moist.     Pharynx: Oropharynx is clear. No oropharyngeal exudate or posterior oropharyngeal erythema.     Tonsils: No tonsillar exudate or tonsillar abscesses.  Eyes:     General: No scleral icterus.       Right eye: No discharge.        Left eye: No discharge.     Conjunctiva/sclera: Conjunctivae normal.  Cardiovascular:     Rate and Rhythm: Normal rate.  Pulmonary:     Effort: Pulmonary effort is normal. No respiratory distress.  Musculoskeletal:        General: Normal range of motion.     Cervical back: Normal range of motion.  Lymphadenopathy:     Cervical: No cervical adenopathy.  Skin:    General: Skin is warm and dry.  Neurological:     Mental Status: He is alert and oriented to person, place, and time. Mental status is at baseline.  Psychiatric:        Mood and Affect: Mood normal.        Behavior: Behavior normal.        Thought Content: Thought content normal.        Judgment: Judgment normal.

## 2023-05-21 NOTE — Addendum Note (Signed)
Addended by: Gwenlyn Fudge on: 05/21/2023 09:56 AM   Modules accepted: Orders

## 2023-07-10 ENCOUNTER — Other Ambulatory Visit: Payer: Self-pay | Admitting: Internal Medicine

## 2023-07-10 ENCOUNTER — Encounter (INDEPENDENT_AMBULATORY_CARE_PROVIDER_SITE_OTHER): Payer: Self-pay

## 2023-07-26 ENCOUNTER — Other Ambulatory Visit (INDEPENDENT_AMBULATORY_CARE_PROVIDER_SITE_OTHER): Payer: No Typology Code available for payment source

## 2023-07-26 DIAGNOSIS — E1165 Type 2 diabetes mellitus with hyperglycemia: Secondary | ICD-10-CM

## 2023-07-26 DIAGNOSIS — E559 Vitamin D deficiency, unspecified: Secondary | ICD-10-CM

## 2023-07-26 LAB — BASIC METABOLIC PANEL
BUN: 24 mg/dL — ABNORMAL HIGH (ref 6–23)
CO2: 31 meq/L (ref 19–32)
Calcium: 9.9 mg/dL (ref 8.4–10.5)
Chloride: 96 meq/L (ref 96–112)
Creatinine, Ser: 1.31 mg/dL (ref 0.40–1.50)
GFR: 54.76 mL/min — ABNORMAL LOW (ref >60.00)
Glucose, Bld: 83 mg/dL (ref 70–99)
Potassium: 3.7 meq/L (ref 3.5–5.1)
Sodium: 132 meq/L — ABNORMAL LOW (ref 135–145)

## 2023-07-26 LAB — HEPATIC FUNCTION PANEL
ALT: 15 U/L (ref 0–53)
AST: 16 U/L (ref 0–37)
Albumin: 4.4 g/dL (ref 3.5–5.2)
Alkaline Phosphatase: 40 U/L (ref 39–117)
Bilirubin, Direct: 0.1 mg/dL (ref 0.0–0.3)
Total Bilirubin: 0.6 mg/dL (ref 0.2–1.2)
Total Protein: 7.2 g/dL (ref 6.0–8.3)

## 2023-07-26 LAB — LIPID PANEL
Cholesterol: 133 mg/dL (ref 0–200)
HDL: 73.8 mg/dL (ref >39.00)
LDL Cholesterol: 46 mg/dL (ref 0–99)
NonHDL: 59.07
Total CHOL/HDL Ratio: 2
Triglycerides: 63 mg/dL (ref 0.0–149.0)
VLDL: 12.6 mg/dL (ref 0.0–40.0)

## 2023-07-26 LAB — VITAMIN D 25 HYDROXY (VIT D DEFICIENCY, FRACTURES): VITD: 61.82 ng/mL (ref 30.00–100.00)

## 2023-07-29 LAB — HEMOGLOBIN A1C: Hgb A1c MFr Bld: 6.2 % (ref 4.6–6.5)

## 2023-07-30 ENCOUNTER — Ambulatory Visit: Payer: No Typology Code available for payment source | Admitting: Internal Medicine

## 2023-07-30 ENCOUNTER — Encounter: Payer: Self-pay | Admitting: Internal Medicine

## 2023-07-30 VITALS — BP 122/74 | HR 67 | Temp 98.0°F | Ht 67.0 in | Wt 224.0 lb

## 2023-07-30 DIAGNOSIS — E1165 Type 2 diabetes mellitus with hyperglycemia: Secondary | ICD-10-CM

## 2023-07-30 DIAGNOSIS — I10 Essential (primary) hypertension: Secondary | ICD-10-CM | POA: Diagnosis not present

## 2023-07-30 DIAGNOSIS — E559 Vitamin D deficiency, unspecified: Secondary | ICD-10-CM

## 2023-07-30 DIAGNOSIS — E7800 Pure hypercholesterolemia, unspecified: Secondary | ICD-10-CM

## 2023-07-30 DIAGNOSIS — Z7984 Long term (current) use of oral hypoglycemic drugs: Secondary | ICD-10-CM

## 2023-07-30 DIAGNOSIS — N1831 Chronic kidney disease, stage 3a: Secondary | ICD-10-CM

## 2023-07-30 DIAGNOSIS — Z125 Encounter for screening for malignant neoplasm of prostate: Secondary | ICD-10-CM

## 2023-07-30 DIAGNOSIS — E538 Deficiency of other specified B group vitamins: Secondary | ICD-10-CM

## 2023-07-30 NOTE — Patient Instructions (Signed)
Please continue all other medications as before, and refills have been done if requested.  Please have the pharmacy call with any other refills you may need.  Please continue your efforts at being more active, low cholesterol diet, and weight control.  You are otherwise up to date with prevention measures today.  Please keep your appointments with your specialists as you may have planned  Please make an Appointment to return in 6 months, or sooner if needed, also with Lab Appointment for testing done 3-5 days before at the FIRST FLOOR Lab (so this is for TWO appointments - please see the scheduling desk as you leave)  

## 2023-07-30 NOTE — Progress Notes (Signed)
Patient ID: Marcus Rivera, male   DOB: 23-Oct-1951, 72 y.o.   MRN: [id number]        Chief Complaint: follow up HTN, HLD and DM, ckd3a       HPI:  Marcus Rivera is a 72 y.o. male here overall doing ok,  Pt denies chest pain, increased sob or doe, wheezing, orthopnea, PND, increased LE swelling, palpitations, dizziness or syncope.   Pt denies polydipsia, polyuria, or new focal neuro s/s.    Pt denies fever, wt loss, night sweats, loss of appetite, or other constitutional symptoms   Wt down 8 lbs from feb 2024, with better diet control like intermittent fasting.  Had an illness in June he thinks may have been covid, with dehydration and slower renal function on labs.    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   DIABETES MELLITUS, TYPE II 04/12/2008   GLUCOSE INTOLERANCE 03/12/2008   Gout    HYPERLIPIDEMIA 04/12/2008   HYPERTENSION 03/12/2008   Prostate cancer (HCC)    PROSTATE CANCER, HX OF 09/05/2009   PSA, INCREASED 03/18/2008   Past Surgical History:  Procedure Laterality Date   COLONOSCOPY     COLONOSCOPY N/A 11/29/2014   Procedure: COLONOSCOPY;  Surgeon: Louis Meckel, MD;  Location: [facility];  Service: Endoscopy;  Laterality: N/A;   ESOPHAGOGASTRODUODENOSCOPY     Late 1980's [facility]   negative stress echo  09/25/2010   [facility]   PROSTATE SURGERY  12/11/2007    reports that he quit smoking about 34 years ago. His smoking use included cigarettes. He started smoking about 50 years ago. He has a 16 pack-year smoking history. He has never used smokeless tobacco. He reports that he does not drink alcohol and does not use drugs. family  history includes Breast cancer in his mother; Cancer in his mother; Colon cancer in his paternal aunt; Diabetes in an other family member; Hypertension in an other family member; Liver disease in his mother; Lung cancer in his mother; Prostate cancer in his father. No Known Allergies Current Outpatient Medications on File Prior to Visit  Medication Sig Dispense Refill   cholecalciferol (VITAMIN D3) 25 MCG (1000 UNIT) tablet Take 1,000 Units by mouth daily.     colchicine 0.6 MG tablet Take 1 tablet (0.6 mg total) by mouth 2 (two) times daily as needed. 60 tablet 5   dapagliflozin propanediol (FARXIGA) 10 MG TABS tablet Take 1 tablet (10 mg total) by mouth daily before breakfast. 90 tablet 3   iron polysaccharides (NIFEREX) 150 MG capsule TAKE 1 CAPSULE BY MOUTH EVERY DAY 90 capsule 1   Magnesium 400 MG CAPS Take by mouth.     metFORMIN (GLUCOPHAGE) 1000 MG tablet Take 1 tablet (1,000 mg total) by mouth 2 (two) times daily with a meal. 180 tablet 3   Multiple Vitamin (MULTIVITAMIN) tablet Take 1 tablet by mouth daily.     Olmesartan-amLODIPine-HCTZ 40-10-25 MG TABS Take 1 tablet by mouth every morning. 90 tablet 3   omeprazole (PRILOSEC) 20 MG capsule Take 1 capsule (20 mg total) by mouth 2 (two) times  daily before a meal. 28 capsule 0   ONETOUCH DELICA LANCETS 33G MISC Use to help check blood sugars twice a day Dx E11.9 300 each 3   rosuvastatin (CRESTOR) 40 MG tablet Take 1 tablet (40 mg total) by mouth daily. 90 tablet 3   No current facility-administered medications on file prior to visit.        ROS:  All others reviewed and negative.  Objective        PE:  BP 122/74 (BP Location: Right Arm, Patient Position: Sitting, Cuff Size: Normal)   Pulse 67   Temp 98 F (36.7 C) (Oral)   Ht 5\' 7"  (1.702 m)   Wt 224 lb (101.6 kg)   SpO2 97%   BMI 35.08 kg/m                 Constitutional: Pt appears in NAD               HENT: Head: NCAT.                Right Ear: External ear normal.                  Left Ear: External ear normal.                Eyes: . Pupils are equal, round, and reactive to light. Conjunctivae and EOM are normal               Nose: without d/c or deformity               Neck: Neck supple. Gross normal ROM               Cardiovascular: Normal rate and regular rhythm.                 Pulmonary/Chest: Effort normal and breath sounds without rales or wheezing.                Abd:  Soft, NT, ND, + BS, no organomegaly               Neurological: Pt is alert. At baseline orientation, motor grossly intact               Skin: Skin is warm. No rashes, no other new lesions, LE edema - none               Psychiatric: Pt behavior is normal without agitation   Micro: none  Cardiac tracings I have personally interpreted today:  none  Pertinent Radiological findings (summarize): none   Lab Results  Component Value Date   WBC 2.6 (L) 01/22/2023   HGB 12.2 (L) 01/22/2023   HCT 37.4 (L) 01/22/2023   PLT 195.0 01/22/2023   GLUCOSE 83 07/26/2023   CHOL 133 07/26/2023   TRIG 63.0 07/26/2023   HDL 73.80 07/26/2023   LDLDIRECT 139.7 09/05/2009   LDLCALC 46 07/26/2023   ALT 15 07/26/2023   AST 16 07/26/2023   NA 132 (L) 07/26/2023   K 3.7 07/26/2023   CL 96 07/26/2023   CREATININE 1.31 07/26/2023   BUN 24 (H) 07/26/2023   CO2 31 07/26/2023   TSH 1.07 05/20/2023   PSA 0.00 (L) 01/22/2023   HGBA1C 6.2 07/26/2023   MICROALBUR 1.3 01/22/2023   Assessment/Plan:  Marcus Rivera is a 72 y.o. Black or African American [2] male with  has a past medical history of ANEMIA-NOS (10/03/2010), Chronic kidney disease (CKD), DIABETES MELLITUS, TYPE  II (04/12/2008), GLUCOSE INTOLERANCE (03/12/2008), Gout, HYPERLIPIDEMIA (04/12/2008), HYPERTENSION (03/12/2008), Prostate cancer [facility]), PROSTATE CANCER, HX OF (09/05/2009), and PSA, INCREASED (03/18/2008).  CKD (chronic kidney disease) stage 3, GFR 30-59 ml/min (HCC) Lab Results  Component Value Date   CREATININE 1.31  07/26/2023   Stable overall, cont to avoid nephrotoxins   Diabetes (HCC) Lab Results  Component Value Date   HGBA1C 6.2 07/26/2023   Stable, pt to continue current medical treatment farxiga 10 every day, metfomrin 100 bid   Essential hypertension BP Readings from Last 3 Encounters:  07/30/23 122/74  05/20/23 (!) 90/58  01/29/23 130/80   Stable, pt to continue medical treatment olmesartan, amlodipine, hct 40-10-25 qd   Hyperlipidemia Lab Results  Component Value Date   LDLCALC 46 07/26/2023   Stable, pt to continue current statin crestor 40 qd   Followup: Return in about 6 months (around 01/30/2024).  Oliver Barre, MD 08/02/2023 10:13 PM [facility]

## 2023-08-02 ENCOUNTER — Encounter: Payer: Self-pay | Admitting: Internal Medicine

## 2023-08-02 NOTE — Assessment & Plan Note (Signed)
Lab Results  Component Value Date   CREATININE 1.31 07/26/2023   Stable overall, cont to avoid nephrotoxins

## 2023-08-02 NOTE — Assessment & Plan Note (Signed)
Lab Results  Component Value Date   LDLCALC 46 07/26/2023   Stable, pt to continue current statin crestor 40 qd

## 2023-08-02 NOTE — Assessment & Plan Note (Signed)
Lab Results  Component Value Date   HGBA1C 6.2 07/26/2023   Stable, pt to continue current medical treatment farxiga 10 every day, metfomrin 100 bid

## 2023-08-02 NOTE — Assessment & Plan Note (Signed)
BP Readings from Last 3 Encounters:  07/30/23 122/74  05/20/23 (!) 90/58  01/29/23 130/80   Stable, pt to continue medical treatment olmesartan, amlodipine, hct 40-10-25 qd

## 2023-09-05 ENCOUNTER — Other Ambulatory Visit: Payer: Self-pay

## 2023-09-05 ENCOUNTER — Other Ambulatory Visit: Payer: No Typology Code available for payment source

## 2023-09-05 DIAGNOSIS — D500 Iron deficiency anemia secondary to blood loss (chronic): Secondary | ICD-10-CM

## 2023-09-17 LAB — LAB REPORT - SCANNED
A1c: 6
Albumin, Urine POC: 22.5
Albumin/Creatinine Ratio, Urine, POC: 40
Creatinine, POC: 56.6 mg/dL
EGFR: 56

## 2023-10-04 ENCOUNTER — Ambulatory Visit (INDEPENDENT_AMBULATORY_CARE_PROVIDER_SITE_OTHER): Payer: No Typology Code available for payment source

## 2023-10-04 VITALS — Temp 97.8°F

## 2023-10-04 DIAGNOSIS — Z23 Encounter for immunization: Secondary | ICD-10-CM

## 2023-10-04 NOTE — Progress Notes (Signed)
Per orders of Dr. Oliver Barre, injection of  High Dose Influzena given by Dorris Fetch in right deltoid. Patient tolerated injection well.

## 2023-10-09 ENCOUNTER — Other Ambulatory Visit: Payer: Self-pay

## 2023-10-09 ENCOUNTER — Telehealth: Payer: Self-pay | Admitting: Internal Medicine

## 2023-10-09 MED ORDER — OLMESARTAN-AMLODIPINE-HCTZ 40-10-25 MG PO TABS: 1.0000 | ORAL_TABLET | ORAL | 3 refills | Status: DC

## 2023-10-09 NOTE — Telephone Encounter (Signed)
Prescription Request  10/09/2023  LOV: 07/30/2023  What is the name of the medication or equipment?  Olmesartan-amLODIPine-HCTZ 40-10-25 MG TABS  Have you contacted your pharmacy to request a refill? Yes   Which pharmacy would you like this sent to?  [id number] [place], [place] - [address] [place] [address] Phone: [phone] Fax: [phone]    Patient notified that their request is being sent to the clinical staff for review and that they should receive a response within 2 business days.   Please advise at Mobile [phone] (mobile)

## 2023-10-09 NOTE — Telephone Encounter (Signed)
Refill sent.

## 2023-10-14 ENCOUNTER — Other Ambulatory Visit: Payer: Self-pay

## 2023-10-14 ENCOUNTER — Telehealth: Payer: Self-pay | Admitting: Internal Medicine

## 2023-10-14 MED ORDER — DAPAGLIFLOZIN PROPANEDIOL 10 MG PO TABS: 10.0000 mg | ORAL_TABLET | Freq: Every day | ORAL | 3 refills | Status: DC

## 2023-10-14 MED ORDER — ROSUVASTATIN CALCIUM 40 MG PO TABS: 40.0000 mg | ORAL_TABLET | Freq: Every day | ORAL | 3 refills | Status: DC

## 2023-10-14 MED ORDER — METFORMIN HCL 1000 MG PO TABS: 1000.0000 mg | ORAL_TABLET | Freq: Two times a day (BID) | ORAL | 3 refills | Status: DC

## 2023-10-14 NOTE — Telephone Encounter (Signed)
Prescription Request  10/14/2023  LOV: 07/30/2023  What is the name of the medication or equipment? dapagliflozin propanediol (FARXIGA) 10 MG TABS tablet  rosuvastatin (CRESTOR) 40 MG tablet  iron polysaccharides (NIFEREX) 150 MG capsule    Have you contacted your pharmacy to request a refill? No   Which pharmacy would you like this sent to?  [id number] [place], [place] - [address] [place] [address] Phone: [phone] Fax: [phone]    Patient notified that their request is being sent to the clinical staff for review and that they should receive a response within 2 business days.   Please advise at Mobile [phone] (mobile)

## 2023-10-14 NOTE — Telephone Encounter (Signed)
Refill has been sent.  °

## 2024-01-06 LAB — HM DIABETES EYE EXAM

## 2024-01-07 ENCOUNTER — Encounter: Payer: Self-pay | Admitting: Internal Medicine

## 2024-01-07 ENCOUNTER — Other Ambulatory Visit: Payer: Self-pay | Admitting: Internal Medicine

## 2024-01-07 ENCOUNTER — Other Ambulatory Visit: Payer: Self-pay

## 2024-01-07 MED ORDER — POLYSACCHARIDE IRON COMPLEX 150 MG PO CAPS: 150.0000 mg | ORAL_CAPSULE | Freq: Every day | ORAL | 1 refills | Status: DC

## 2024-01-07 NOTE — Telephone Encounter (Signed)
Copied from CRM [id number]. Topic: Clinical - Medication Refill >> Jan 07, 2024  9:52 AM Denese Killings wrote: Most Recent Primary Care Visit:  Provider: Dorris Fetch  Department: [facility]  Visit Type: NURSE VISIT  Date: 10/04/2023  Medication: iron polysaccharides (NIFEREX) 150 MG capsule  Has the patient contacted their pharmacy? Yes (Agent: If no, request that the patient contact the pharmacy for the refill. If patient does not wish to contact the pharmacy document the reason why and proceed with request.) (Agent: If yes, when and what did the pharmacy advise?)  Is this the correct pharmacy for this prescription? Yes If no, delete pharmacy and type the correct one.  This is the patient's preferred pharmacy:  [facility] [address] [place] - [address] [place] [address] Phone: [phone] Fax: [phone]   Has the prescription been filled recently? No  Is the patient out of the medication? No Patient has 2 left   Has the patient been seen for an appointment in the last year OR does the patient have an upcoming appointment? Yes  Can we respond through MyChart? Yes  Agent: Please be advised that Rx refills may take up to 3 business days. We ask that you follow-up with your pharmacy.

## 2024-01-16 ENCOUNTER — Telehealth: Payer: Self-pay

## 2024-01-16 ENCOUNTER — Other Ambulatory Visit: Payer: Self-pay

## 2024-01-16 MED ORDER — POLYSACCHARIDE IRON COMPLEX 150 MG PO CAPS: 150.0000 mg | ORAL_CAPSULE | Freq: Every day | ORAL | 1 refills | Status: DC

## 2024-01-16 NOTE — Telephone Encounter (Signed)
 Copied from CRM [id number]. Topic: Clinical - Prescription Issue >> Jan 16, 2024  1:29 PM Rolin D wrote: Reason for CRM: Patient called in stating that a medication refill was submitted by pharmacy and patient last week for iron  polysaccharides (NIFEREX) 150 MG capsule. Per patients chart medication refill was completed and sent on 1/28. Patient has reached out to pharmacy today and pharmacy does not have the refill .[facility] [address] [place] [address] Phone: [phone] Fax: [phone]

## 2024-01-16 NOTE — Telephone Encounter (Signed)
Medication has been resent

## 2024-01-30 ENCOUNTER — Ambulatory Visit: Payer: No Typology Code available for payment source | Admitting: Internal Medicine

## 2024-03-18 ENCOUNTER — Other Ambulatory Visit

## 2024-03-18 DIAGNOSIS — Z125 Encounter for screening for malignant neoplasm of prostate: Secondary | ICD-10-CM

## 2024-03-18 DIAGNOSIS — E538 Deficiency of other specified B group vitamins: Secondary | ICD-10-CM | POA: Diagnosis not present

## 2024-03-18 DIAGNOSIS — E559 Vitamin D deficiency, unspecified: Secondary | ICD-10-CM

## 2024-03-18 DIAGNOSIS — E1165 Type 2 diabetes mellitus with hyperglycemia: Secondary | ICD-10-CM

## 2024-03-18 LAB — HEPATIC FUNCTION PANEL
ALT: 18 U/L (ref 0–53)
AST: 16 U/L (ref 0–37)
Albumin: 4.7 g/dL (ref 3.5–5.2)
Alkaline Phosphatase: 47 U/L (ref 39–117)
Bilirubin, Direct: 0.1 mg/dL (ref 0.0–0.3)
Total Bilirubin: 0.6 mg/dL (ref 0.2–1.2)
Total Protein: 7.2 g/dL (ref 6.0–8.3)

## 2024-03-18 LAB — CBC WITH DIFFERENTIAL/PLATELET
Basophils Absolute: 0 10*3/uL (ref 0.0–0.1)
Basophils Relative: 0.5 % (ref 0.0–3.0)
Eosinophils Absolute: 0 10*3/uL (ref 0.0–0.7)
Eosinophils Relative: 2.3 % (ref 0.0–5.0)
HCT: 36 % — ABNORMAL LOW (ref 39.0–52.0)
Hemoglobin: 11.9 g/dL — ABNORMAL LOW (ref 13.0–17.0)
Lymphocytes Relative: 25.9 % (ref 12.0–46.0)
Lymphs Abs: 0.5 10*3/uL — ABNORMAL LOW (ref 0.7–4.0)
MCHC: 33 g/dL (ref 30.0–36.0)
MCV: 79 fl (ref 78.0–100.0)
Monocytes Absolute: 0.2 10*3/uL (ref 0.1–1.0)
Monocytes Relative: 11.4 % (ref 3.0–12.0)
Neutro Abs: 1.2 10*3/uL — ABNORMAL LOW (ref 1.4–7.7)
Neutrophils Relative %: 59.9 % (ref 43.0–77.0)
Platelets: 202 10*3/uL (ref 150.0–400.0)
RBC: 4.55 Mil/uL (ref 4.22–5.81)
RDW: 14.8 % (ref 11.5–15.5)
WBC: 2.1 10*3/uL — ABNORMAL LOW (ref 4.0–10.5)

## 2024-03-18 LAB — BASIC METABOLIC PANEL WITH GFR
BUN: 25 mg/dL — ABNORMAL HIGH (ref 6–23)
CO2: 30 meq/L (ref 19–32)
Calcium: 9.9 mg/dL (ref 8.4–10.5)
Chloride: 100 meq/L (ref 96–112)
Creatinine, Ser: 1.6 mg/dL — ABNORMAL HIGH (ref 0.40–1.50)
GFR: 42.88 mL/min — ABNORMAL LOW (ref >60.00)
Glucose, Bld: 99 mg/dL (ref 70–99)
Potassium: 4.1 meq/L (ref 3.5–5.1)
Sodium: 140 meq/L (ref 135–145)

## 2024-03-18 LAB — URINALYSIS, ROUTINE W REFLEX MICROSCOPIC
Bilirubin Urine: NEGATIVE
Hgb urine dipstick: NEGATIVE
Ketones, ur: NEGATIVE
Leukocytes,Ua: NEGATIVE
Nitrite: NEGATIVE
RBC / HPF: NONE SEEN (ref 0–?)
Specific Gravity, Urine: 1.01 (ref 1.000–1.030)
Total Protein, Urine: NEGATIVE
Urine Glucose: >=1000 — AB
Urobilinogen, UA: 0.2 (ref 0.0–1.0)
pH: 6 (ref 5.0–8.0)

## 2024-03-18 LAB — PSA: PSA: 0 ng/mL — ABNORMAL LOW (ref 0.10–4.00)

## 2024-03-18 LAB — LIPID PANEL
Cholesterol: 154 mg/dL (ref 0–200)
HDL: 85.3 mg/dL (ref >39.00)
LDL Cholesterol: 57 mg/dL (ref 0–99)
NonHDL: 68.63
Total CHOL/HDL Ratio: 2
Triglycerides: 56 mg/dL (ref 0.0–149.0)
VLDL: 11.2 mg/dL (ref 0.0–40.0)

## 2024-03-18 LAB — HEMOGLOBIN A1C: Hgb A1c MFr Bld: 6.3 % (ref 4.6–6.5)

## 2024-03-18 LAB — MICROALBUMIN / CREATININE URINE RATIO
Creatinine,U: 59.7 mg/dL
Microalb Creat Ratio: 13.8 mg/g (ref 0.0–30.0)
Microalb, Ur: 0.8 mg/dL (ref 0.0–1.9)

## 2024-03-18 LAB — VITAMIN D 25 HYDROXY (VIT D DEFICIENCY, FRACTURES): VITD: 59.04 ng/mL (ref 30.00–100.00)

## 2024-03-18 LAB — VITAMIN B12: Vitamin B-12: >1537 pg/mL — ABNORMAL HIGH (ref 211–911)

## 2024-03-18 LAB — TSH: TSH: 1.48 u[IU]/mL (ref 0.35–5.50)

## 2024-03-20 ENCOUNTER — Ambulatory Visit: Admitting: Internal Medicine

## 2024-03-20 ENCOUNTER — Encounter: Payer: Self-pay | Admitting: Internal Medicine

## 2024-03-20 VITALS — BP 124/70 | HR 70 | Temp 99.1°F | Ht 67.0 in | Wt 234.0 lb

## 2024-03-20 DIAGNOSIS — E559 Vitamin D deficiency, unspecified: Secondary | ICD-10-CM | POA: Diagnosis not present

## 2024-03-20 DIAGNOSIS — E1165 Type 2 diabetes mellitus with hyperglycemia: Secondary | ICD-10-CM

## 2024-03-20 DIAGNOSIS — Z0000 Encounter for general adult medical examination without abnormal findings: Secondary | ICD-10-CM

## 2024-03-20 DIAGNOSIS — N1831 Chronic kidney disease, stage 3a: Secondary | ICD-10-CM

## 2024-03-20 DIAGNOSIS — E7800 Pure hypercholesterolemia, unspecified: Secondary | ICD-10-CM

## 2024-03-20 DIAGNOSIS — I10 Essential (primary) hypertension: Secondary | ICD-10-CM | POA: Diagnosis not present

## 2024-03-20 DIAGNOSIS — Z7984 Long term (current) use of oral hypoglycemic drugs: Secondary | ICD-10-CM

## 2024-03-20 DIAGNOSIS — R9431 Abnormal electrocardiogram [ECG] [EKG]: Secondary | ICD-10-CM | POA: Diagnosis not present

## 2024-03-20 DIAGNOSIS — E538 Deficiency of other specified B group vitamins: Secondary | ICD-10-CM

## 2024-03-20 DIAGNOSIS — Z0001 Encounter for general adult medical examination with abnormal findings: Secondary | ICD-10-CM

## 2024-03-20 NOTE — Patient Instructions (Addendum)
 Please consider the Pneumococcal 21 if available yet, and the RSV shot at the pharmacy  Ok to reduce the B12 to Mon - Wed- Fri only  Please continue all other medications as before, and refills have been done if requested.  Please have the pharmacy call with any other refills you may need.  Please continue your efforts at being more active, low cholesterol diet, and weight control.  You are otherwise up to date with prevention measures today.  Please keep your appointments with your specialists as you may have planned  You will be contacted regarding the referral for: Cardiac CT score  Please go to the LAB at the blood drawing area for the tests to be done  You will be contacted by phone if any changes need to be made immediately.  Otherwise, you will receive a letter about your results with an explanation, but please check with MyChart first.  Please make an Appointment to return in 6 months, or sooner if needed, also with Lab Appointment for testing done 3-5 days before at the FIRST FLOOR Lab (so this is for TWO appointments - please see the scheduling desk as you leave)

## 2024-03-20 NOTE — Progress Notes (Addendum)
 Patient ID: Marcus Rivera, male   DOB: 1951/05/02, 73 y.o.   MRN: [id number]         Chief Complaint:: wellness exam and low b12, ckd3a, dm, hld,        HPI:  Marcus Rivera is a 73 y.o. male here for wellness exam; up to date                        Also Pt denies chest pain, increased sob or doe, wheezing, orthopnea, PND, increased LE swelling, palpitations, dizziness or syncope.   Pt denies polydipsia, polyuria, or new focal neuro s/s.    Pt denies fever, wt loss, night sweats, loss of appetite, or other constitutional symptoms  Willing for card CT score.     Wt Readings from Last 3 Encounters:  03/20/24 234 lb (106.1 kg)  07/30/23 224 lb (101.6 kg)  05/20/23 224 lb (101.6 kg)   BP Readings from Last 3 Encounters:  03/20/24 124/70  07/30/23 122/74  05/20/23 (!) 90/58   Immunization History  Administered Date(s) Administered   Fluad Quad(high Dose 65+) 09/11/2019, 09/16/2020, 09/21/2021, 01/29/2023   Fluad Trivalent(High Dose 65+) 10/04/2023   Influenza Whole 09/26/2010   Influenza, High Dose Seasonal PF 09/06/2017   Moderna Sars-Covid-2 Vaccination 02/18/2020, 03/16/2020, 10/10/2020   Pneumococcal Conjugate-13 03/05/2017   Pneumococcal Polysaccharide-23 09/10/2018   Tdap 09/04/2016   Zoster Recombinant(Shingrix) 10/06/2021, 01/29/2023   There are no preventive care reminders to display for this patient.     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   DIABETES MELLITUS, TYPE II 04/12/2008   GLUCOSE INTOLERANCE 03/12/2008   Gout    HYPERLIPIDEMIA 04/12/2008   HYPERTENSION 03/12/2008   Prostate cancer (HCC)    PROSTATE CANCER, HX OF 09/05/2009   PSA, INCREASED 03/18/2008   Past Surgical History:  Procedure Laterality Date   COLONOSCOPY     COLONOSCOPY N/A 11/29/2014   Procedure: COLONOSCOPY;  Surgeon: Claudette Cue, MD;  Location: [facility];  Service: Endoscopy;   Laterality: N/A;   ESOPHAGOGASTRODUODENOSCOPY     Late 1980's [facility] [place]   negative stress echo  09/25/2010   [facility]   PROSTATE SURGERY  12/11/2007    reports that he quit smoking about 35 years ago. His smoking use included cigarettes. He started smoking about 51 years ago. He has a 16 pack-year smoking history. He has never used smokeless tobacco. He reports that he does not drink alcohol and does not use drugs. family history includes Breast cancer in his mother; Cancer in his mother; Colon cancer in his paternal aunt; Diabetes in an other family member; Hypertension in an other family member; Liver disease in his mother; Lung cancer in his mother; Prostate cancer in his father. No Known Allergies Current Outpatient Medications on File Prior to Visit  Medication Sig Dispense Refill   cholecalciferol (VITAMIN D3) 25 MCG (1000 UNIT) tablet Take 1,000 Units by mouth daily.     colchicine 0.6 MG tablet Take 1 tablet (0.6 mg total) by mouth 2 (two) times daily as needed. 60 tablet 5   dapagliflozin propanediol (FARXIGA) 10 MG TABS tablet Take 1 tablet (10 mg total) by mouth daily before breakfast. 90 tablet 3   iron polysaccharides (NIFEREX) 150 MG capsule Take 1 capsule (150 mg total) by mouth daily. 90 capsule 1   Magnesium 400 MG CAPS Take  by mouth.     metFORMIN (GLUCOPHAGE) 1000 MG tablet Take 1 tablet (1,000 mg total) by mouth 2 (two) times daily with a meal. 180 tablet 3   Multiple Vitamin (MULTIVITAMIN) tablet Take 1 tablet by mouth daily.     Olmesartan-amLODIPine-HCTZ 40-10-25 MG TABS Take 1 tablet by mouth every morning. 90 tablet 3   omeprazole (PRILOSEC) 20 MG capsule Take 1 capsule (20 mg total) by mouth 2 (two) times daily before a meal. 28 capsule 0   ONETOUCH DELICA LANCETS 33G MISC Use to help check blood sugars twice a day Dx E11.9 300 each 3   rosuvastatin (CRESTOR) 40 MG tablet Take 1 tablet (40 mg total) by mouth daily. 90 tablet 3   No current  facility-administered medications on file prior to visit.        ROS:  All others reviewed and negative.  Objective        PE:  BP 124/70 (BP Location: Right Arm, Patient Position: Sitting, Cuff Size: Normal)   Pulse 70   Temp 99.1 F (37.3 C) (Oral)   Ht 5\' 7"  (1.702 m)   Wt 234 lb (106.1 kg)   SpO2 96%   BMI 36.65 kg/m                 Constitutional: Pt appears in NAD               HENT: Head: NCAT.                Right Ear: External ear normal.                 Left Ear: External ear normal.                Eyes: . Pupils are equal, round, and reactive to light. Conjunctivae and EOM are normal               Nose: without d/c or deformity               Neck: Neck supple. Gross normal ROM               Cardiovascular: Normal rate and regular rhythm.                 Pulmonary/Chest: Effort normal and breath sounds without rales or wheezing.                Abd:  Soft, NT, ND, + BS, no organomegaly               Neurological: Pt is alert. At baseline orientation, motor grossly intact               Skin: Skin is warm. No rashes, no other new lesions, LE edema - none               Psychiatric: Pt behavior is normal without agitation   Micro: none  Cardiac tracings I have personally interpreted today:  none  Pertinent Radiological findings (summarize): none   Lab Results  Component Value Date   WBC 2.1 (L) 03/18/2024   HGB 11.9 (L) 03/18/2024   HCT 36.0 (L) 03/18/2024   PLT 202.0 03/18/2024   GLUCOSE 99 03/18/2024   CHOL 154 03/18/2024   TRIG 56.0 03/18/2024   HDL 85.30 03/18/2024   LDLDIRECT 139.7 09/05/2009   LDLCALC 57 03/18/2024   ALT 18 03/18/2024   AST 16 03/18/2024   NA 140 03/18/2024   K 4.1 03/18/2024  CL 100 03/18/2024   CREATININE 1.60 (H) 03/18/2024   BUN 25 (H) 03/18/2024   CO2 30 03/18/2024   TSH 1.48 03/18/2024   PSA 0.00 (L) 03/18/2024   HGBA1C 6.3 03/18/2024   MICROALBUR 0.8 03/18/2024   Assessment/Plan:  Marcus Rivera is a 73 y.o.  Black or African American [2] male with  has a past medical history of ANEMIA-NOS (10/03/2010), Chronic kidney disease (CKD), DIABETES MELLITUS, TYPE II (04/12/2008), GLUCOSE INTOLERANCE (03/12/2008), Gout, HYPERLIPIDEMIA (04/12/2008), HYPERTENSION (03/12/2008), Prostate cancer (HCC), PROSTATE CANCER, HX OF (09/05/2009), and PSA, INCREASED (03/18/2008).  Encounter for well adult exam with abnormal findings Age and sex appropriate education and counseling updated with regular exercise and diet Referrals for preventative services - none needed Immunizations addressed - none needed Smoking counseling  - none needed Evidence for depression or other mood disorder - none significant Most recent labs reviewed. I have personally reviewed and have noted: 1) the patient's medical and social history 2) The patient's current medications and supplements 3) The patient's height, weight, and BMI have been recorded in the chart   Diabetes [facility]) Lab Results  Component Value Date   HGBA1C 6.3 03/18/2024   Stable, pt to continue current medical treatment farxiga 10 every day, metformin 1000 bid, also for Card CT score   Hyperlipidemia Lab Results  Component Value Date   LDLCALC 57 03/18/2024   Stable, pt to continue current statin crestor 40 mg qd   Essential hypertension BP Readings from Last 3 Encounters:  03/20/24 124/70  07/30/23 122/74  05/20/23 (!) 90/58   Stable, pt to continue medical treatment tribenzor 40 - 10 - 25 qd   CKD (chronic kidney disease) stage 3, GFR 30-59 ml/min (HCC) Lab Results  Component Value Date   CREATININE 1.60 (H) 03/18/2024   Stable overall, cont to avoid nephrotoxins, f/u renal asplanned   B12 deficiency Lab Results  Component Value Date   VITAMINB12 >1537 (H) 03/18/2024   Now overcontrolled, pt to cont oral replacement - b12 1000 mcg every day reduced to mon - wed - fri  Followup: Return in about 6 months (around 09/19/2024).  Rosalia Colonel, MD  03/21/2024 10:22 PM [facility]

## 2024-03-21 ENCOUNTER — Encounter: Payer: Self-pay | Admitting: Internal Medicine

## 2024-03-21 DIAGNOSIS — E538 Deficiency of other specified B group vitamins: Secondary | ICD-10-CM | POA: Insufficient documentation

## 2024-03-21 NOTE — Assessment & Plan Note (Signed)
 Lab Results  Component Value Date   VITAMINB12 >1537 (H) 03/18/2024   Now overcontrolled, pt to cont oral replacement - b12 1000 mcg every day reduced to mon - wed - fri

## 2024-03-21 NOTE — Assessment & Plan Note (Signed)

## 2024-03-21 NOTE — Assessment & Plan Note (Signed)
 Lab Results  Component Value Date   LDLCALC 57 03/18/2024   Stable, pt to continue current statin crestor 40 mg qd

## 2024-03-21 NOTE — Assessment & Plan Note (Signed)
 BP Readings from Last 3 Encounters:  03/20/24 124/70  07/30/23 122/74  05/20/23 (!) 90/58   Stable, pt to continue medical treatment tribenzor 40 - 10 - 25 qd

## 2024-03-21 NOTE — Addendum Note (Signed)
 Addended by: Roslyn Coombe on: 03/21/2024 10:25 PM   Modules accepted: Level of Service

## 2024-03-21 NOTE — Assessment & Plan Note (Signed)
 Lab Results  Component Value Date   HGBA1C 6.3 03/18/2024   Stable, pt to continue current medical treatment farxiga 10 every day, metformin 1000 bid, also for Card CT score

## 2024-03-21 NOTE — Assessment & Plan Note (Addendum)
 Lab Results  Component Value Date   CREATININE 1.60 (H) 03/18/2024   Stable overall, cont to avoid nephrotoxins, f/u renal asplanned

## 2024-04-24 ENCOUNTER — Other Ambulatory Visit (HOSPITAL_BASED_OUTPATIENT_CLINIC_OR_DEPARTMENT_OTHER)

## 2024-07-16 ENCOUNTER — Other Ambulatory Visit: Payer: Self-pay

## 2024-07-16 ENCOUNTER — Other Ambulatory Visit: Payer: Self-pay | Admitting: Internal Medicine

## 2024-07-16 MED ORDER — POLYSACCHARIDE IRON COMPLEX 150 MG PO CAPS: 150.0000 mg | ORAL_CAPSULE | Freq: Every day | ORAL | 1 refills | Status: DC

## 2024-07-16 NOTE — Telephone Encounter (Signed)
 Copied from CRM [id number]. Topic: Clinical - Medication Refill >> Jul 16, 2024 10:02 AM Laymon HERO wrote: Medication: iron  polysaccharides (NIFEREX) 150 MG capsule  Has the patient contacted their pharmacy? Yes (Agent: If no, request that the patient contact the pharmacy for the refill. If patient does not wish to contact the pharmacy document the reason why and proceed with request.) (Agent: If yes, when and what did the pharmacy advise?)  This is the patient's preferred pharmacy:  [id number] [place],  - [address] [place]  [address] Phone: [phone] Fax: [phone]  Is this the correct pharmacy for this prescription? Yes If no, delete pharmacy and type the correct one.   Has the prescription been filled recently? Yes  Is the patient out of the medication? Yes  Has the patient been seen for an appointment in the last year OR does the patient have an upcoming appointment? Yes  Can we respond through MyChart? Yes  Agent: Please be advised that Rx refills may take up to 3 business days. We ask that you follow-up with your pharmacy.

## 2024-09-21 ENCOUNTER — Other Ambulatory Visit (INDEPENDENT_AMBULATORY_CARE_PROVIDER_SITE_OTHER)

## 2024-09-21 DIAGNOSIS — E559 Vitamin D deficiency, unspecified: Secondary | ICD-10-CM | POA: Diagnosis not present

## 2024-09-21 DIAGNOSIS — E1165 Type 2 diabetes mellitus with hyperglycemia: Secondary | ICD-10-CM | POA: Diagnosis not present

## 2024-09-21 DIAGNOSIS — E538 Deficiency of other specified B group vitamins: Secondary | ICD-10-CM

## 2024-09-21 LAB — LIPID PANEL
Cholesterol: 146 mg/dL (ref 0–200)
HDL: 81.3 mg/dL (ref >39.00)
LDL Cholesterol: 56 mg/dL (ref 0–99)
NonHDL: 64.29
Total CHOL/HDL Ratio: 2
Triglycerides: 39 mg/dL (ref 0.0–149.0)
VLDL: 7.8 mg/dL (ref 0.0–40.0)

## 2024-09-21 LAB — BASIC METABOLIC PANEL WITH GFR
BUN: 26 mg/dL — ABNORMAL HIGH (ref 6–23)
CO2: 30 meq/L (ref 19–32)
Calcium: 9.4 mg/dL (ref 8.4–10.5)
Chloride: 102 meq/L (ref 96–112)
Creatinine, Ser: 1.6 mg/dL — ABNORMAL HIGH (ref 0.40–1.50)
GFR: 42.73 mL/min — ABNORMAL LOW (ref >60.00)
Glucose, Bld: 115 mg/dL — ABNORMAL HIGH (ref 70–99)
Potassium: 4.9 meq/L (ref 3.5–5.1)
Sodium: 139 meq/L (ref 135–145)

## 2024-09-21 LAB — VITAMIN D 25 HYDROXY (VIT D DEFICIENCY, FRACTURES): VITD: 53.6 ng/mL (ref 30.00–100.00)

## 2024-09-21 LAB — HEPATIC FUNCTION PANEL
ALT: 21 U/L (ref 0–53)
AST: 19 U/L (ref 0–37)
Albumin: 4.3 g/dL (ref 3.5–5.2)
Alkaline Phosphatase: 45 U/L (ref 39–117)
Bilirubin, Direct: 0.1 mg/dL (ref 0.0–0.3)
Total Bilirubin: 0.4 mg/dL (ref 0.2–1.2)
Total Protein: 6.6 g/dL (ref 6.0–8.3)

## 2024-09-21 LAB — HEMOGLOBIN A1C: Hgb A1c MFr Bld: 6.5 % (ref 4.6–6.5)

## 2024-09-21 LAB — VITAMIN B12: Vitamin B-12: 590 pg/mL (ref 211–911)

## 2024-09-22 ENCOUNTER — Encounter: Payer: Self-pay | Admitting: Internal Medicine

## 2024-09-22 ENCOUNTER — Ambulatory Visit: Admitting: Internal Medicine

## 2024-09-22 VITALS — BP 120/74 | HR 59 | Temp 98.6°F | Ht 67.0 in | Wt 223.0 lb

## 2024-09-22 DIAGNOSIS — I10 Essential (primary) hypertension: Secondary | ICD-10-CM

## 2024-09-22 DIAGNOSIS — Z125 Encounter for screening for malignant neoplasm of prostate: Secondary | ICD-10-CM

## 2024-09-22 DIAGNOSIS — R9431 Abnormal electrocardiogram [ECG] [EKG]: Secondary | ICD-10-CM

## 2024-09-22 DIAGNOSIS — D509 Iron deficiency anemia, unspecified: Secondary | ICD-10-CM

## 2024-09-22 DIAGNOSIS — Z23 Encounter for immunization: Secondary | ICD-10-CM

## 2024-09-22 DIAGNOSIS — E7800 Pure hypercholesterolemia, unspecified: Secondary | ICD-10-CM

## 2024-09-22 DIAGNOSIS — E1165 Type 2 diabetes mellitus with hyperglycemia: Secondary | ICD-10-CM | POA: Diagnosis not present

## 2024-09-22 DIAGNOSIS — Z7984 Long term (current) use of oral hypoglycemic drugs: Secondary | ICD-10-CM

## 2024-09-22 MED ORDER — OLMESARTAN-AMLODIPINE-HCTZ 40-10-25 MG PO TABS
1.0000 | ORAL_TABLET | ORAL | 3 refills | Status: AC
Start: 2024-09-22 — End: ?

## 2024-09-22 MED ORDER — COLCHICINE 0.6 MG PO TABS: 0.6000 mg | ORAL_TABLET | Freq: Two times a day (BID) | ORAL | 5 refills | Status: AC | PRN

## 2024-09-22 MED ORDER — ROSUVASTATIN CALCIUM 40 MG PO TABS: 40.0000 mg | ORAL_TABLET | Freq: Every day | ORAL | 3 refills | Status: AC

## 2024-09-22 MED ORDER — METFORMIN HCL 1000 MG PO TABS: 1000.0000 mg | ORAL_TABLET | Freq: Two times a day (BID) | ORAL | 3 refills | Status: AC

## 2024-09-22 MED ORDER — POLYSACCHARIDE IRON COMPLEX 150 MG PO CAPS: 150.0000 mg | ORAL_CAPSULE | Freq: Every day | ORAL | 1 refills | Status: AC

## 2024-09-22 MED ORDER — DAPAGLIFLOZIN PROPANEDIOL 10 MG PO TABS: 10.0000 mg | ORAL_TABLET | Freq: Every day | ORAL | 3 refills | Status: AC

## 2024-09-22 NOTE — Progress Notes (Unsigned)
 Patient ID: Marcus Rivera, male   DOB: 26-Apr-1951, 73 y.o.   MRN: [id number]        Chief Complaint: follow up HTN, HLD and hyperglycemia ***       HPI:  Marcus Rivera is a 73 y.o. male here with c/o         Lost wt again in last 6 mo after some wt gain with better diet.  Does not want GLP-1 for now.  Sees renal for CKD3a.  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   DIABETES MELLITUS, TYPE II 04/12/2008   GLUCOSE INTOLERANCE 03/12/2008   Gout    HYPERLIPIDEMIA 04/12/2008   HYPERTENSION 03/12/2008   Prostate cancer (HCC)    PROSTATE CANCER, HX OF 09/05/2009   PSA, INCREASED 03/18/2008   Past Surgical History:  Procedure Laterality Date   COLONOSCOPY     COLONOSCOPY N/A 11/29/2014   Procedure: COLONOSCOPY;  Surgeon: Lamar JONETTA Aho, MD;  Location: [facility];  Service: Endoscopy;  Laterality: N/A;   ESOPHAGOGASTRODUODENOSCOPY     Late 1980's [facility] [place]   negative stress echo  09/25/2010   [facility]   PROSTATE SURGERY  12/11/2007    reports that he quit smoking about 35 years ago. His smoking use included cigarettes. He started smoking about 51 years ago. He has a 16 pack-year smoking history. He has never used smokeless tobacco. He reports that he does not drink alcohol and does not use drugs. family history includes Breast cancer in his mother; Cancer in his mother; Colon cancer in his paternal aunt; Diabetes in an other family member; Hypertension in an other family member; Liver disease in his mother; Lung cancer in his mother; Prostate cancer in his father. No Known Allergies Current Outpatient Medications on File Prior  to Visit  Medication Sig Dispense Refill   cholecalciferol (VITAMIN D3) 25 MCG (1000 UNIT) tablet Take 1,000 Units by mouth daily.     colchicine  0.6 MG tablet Take 1 tablet (0.6 mg total) by mouth 2 (two) times daily as needed. 60 tablet 5   dapagliflozin  propanediol (FARXIGA ) 10 MG TABS tablet Take 1 tablet (10 mg total) by mouth daily before breakfast. 90 tablet 3   iron  polysaccharides (NIFEREX) 150 MG capsule Take 1 capsule (150 mg total) by mouth daily. 90 capsule 1   Magnesium 400 MG CAPS Take by mouth.     metFORMIN  (GLUCOPHAGE ) 1000 MG tablet Take 1 tablet (1,000 mg total) by mouth 2 (two) times daily with a meal. 180 tablet 3   Multiple Vitamin (MULTIVITAMIN) tablet Take 1 tablet by mouth daily.     Olmesartan -amLODIPine -HCTZ 40-10-25 MG TABS Take 1 tablet by mouth every morning. 90 tablet 3   omeprazole  (PRILOSEC) 20 MG capsule Take 1 capsule (20 mg total) by mouth 2 (two) times daily before a meal. 28 capsule 0   ONETOUCH DELICA LANCETS 33G MISC Use to help check blood sugars twice a day Dx E11.9 300 each 3   rosuvastatin  (CRESTOR ) 40 MG tablet Take 1 tablet (40 mg total) by mouth daily. 90 tablet  3   No current facility-administered medications on file prior to visit.        ROS:  All others reviewed and negative.  Objective        PE:  BP 120/74 (BP Location: Right Arm, Patient Position: Sitting, Cuff Size: Normal)   Pulse (!) 59   Temp 98.6 F (37 C) (Oral)   Ht 5' 7 (1.702 m)   Wt 223 lb (101.2 kg)   SpO2 98%   BMI 34.93 kg/m                 Constitutional: Pt appears in NAD               HENT: Head: NCAT.                Right Ear: External ear normal.                 Left Ear: External ear normal.                Eyes: . Pupils are equal, round, and reactive to light. Conjunctivae and EOM are normal               Nose: without d/c or deformity               Neck: Neck supple. Gross normal ROM               Cardiovascular: Normal rate and regular rhythm.                  Pulmonary/Chest: Effort normal and breath sounds without rales or wheezing.                Abd:  Soft, NT, ND, + BS, no organomegaly               Neurological: Pt is alert. At baseline orientation, motor grossly intact               Skin: Skin is warm. No rashes, no other new lesions, LE edema - ***               Psychiatric: Pt behavior is normal without agitation   Micro: none  Cardiac tracings I have personally interpreted today:  none  Pertinent Radiological findings (summarize): none   Lab Results  Component Value Date   WBC 2.1 (L) 03/18/2024   HGB 11.9 (L) 03/18/2024   HCT 36.0 (L) 03/18/2024   PLT 202.0 03/18/2024   GLUCOSE 115 (H) 09/21/2024   CHOL 146 09/21/2024   TRIG 39.0 09/21/2024   HDL 81.30 09/21/2024   LDLDIRECT 139.7 09/05/2009   LDLCALC 56 09/21/2024   ALT 21 09/21/2024   AST 19 09/21/2024   NA 139 09/21/2024   K 4.9 09/21/2024   CL 102 09/21/2024   CREATININE 1.60 (H) 09/21/2024   BUN 26 (H) 09/21/2024   CO2 30 09/21/2024   TSH 1.48 03/18/2024   PSA 0.00 (L) 03/18/2024   HGBA1C 6.5 09/21/2024   MICROALBUR 0.8 03/18/2024   Assessment/Plan:  Marcus Rivera is a 73 y.o. Black or African American [2] male with  has a past medical history of ANEMIA-NOS (10/03/2010), Chronic kidney disease (CKD), DIABETES MELLITUS, TYPE II (04/12/2008), GLUCOSE INTOLERANCE (03/12/2008), Gout, HYPERLIPIDEMIA (04/12/2008), HYPERTENSION (03/12/2008), Prostate cancer (HCC), PROSTATE CANCER, HX OF (09/05/2009), and PSA, INCREASED (03/18/2008).  No problem-specific Assessment & Plan notes found for this encounter.  Followup: No follow-ups on file.  Lynwood Rush, MD 09/22/2024  8:12 AM [facility] - [facility]

## 2024-09-22 NOTE — Patient Instructions (Addendum)
 You had the flu shot today  Please continue all other medications as before, and refills have been done if requested.  Please have the pharmacy call with any other refills you may need.  Please continue your efforts at being more active, low cholesterol diet, and weight control.  You are otherwise up to date with prevention measures today.  Please keep your appointments with your specialists as you may have planned  You will be contacted regarding the referral for: Cardiac CT score  Please make an Appointment to return in 6 months, or sooner if needed, also with Lab Appointment for testing done 3-5 days before at the FIRST FLOOR Lab (so this is for TWO appointments - please see the scheduling desk as you leave)

## 2024-09-24 ENCOUNTER — Encounter: Payer: Self-pay | Admitting: Internal Medicine

## 2024-09-24 NOTE — Assessment & Plan Note (Signed)
 Asymptomatic, no recent overt bleeding, will have labs next visit to f/u

## 2024-09-24 NOTE — Assessment & Plan Note (Signed)
 BP Readings from Last 3 Encounters:  09/22/24 120/74  03/20/24 124/70  07/30/23 122/74   Stable, pt to continue medical treatment amlod hct 40 25 mg qd

## 2024-09-24 NOTE — Assessment & Plan Note (Signed)
 Lab Results  Component Value Date   LDLCALC 56 09/21/2024   Stable, pt to continue current statin crestor  40 mg qd

## 2024-09-24 NOTE — Assessment & Plan Note (Signed)
 Lab Results  Component Value Date   HGBA1C 6.5 09/21/2024   Stable, pt to continue current medical treatment farxiga  10 mg every day, metformin  1000 bid

## 2024-10-06 ENCOUNTER — Ambulatory Visit (HOSPITAL_BASED_OUTPATIENT_CLINIC_OR_DEPARTMENT_OTHER)
Admission: RE | Admit: 2024-10-06 | Discharge: 2024-10-06 | Disposition: A | Discharge status: Home / Self Care | Payer: Self-pay | Source: Ambulatory Visit | Attending: Internal Medicine | Admitting: Internal Medicine

## 2024-10-06 ENCOUNTER — Ambulatory Visit: Payer: Self-pay | Admitting: Internal Medicine

## 2024-10-06 DIAGNOSIS — R9431 Abnormal electrocardiogram [ECG] [EKG]: Secondary | ICD-10-CM

## 2024-10-06 DIAGNOSIS — E1165 Type 2 diabetes mellitus with hyperglycemia: Secondary | ICD-10-CM

## 2024-10-06 DIAGNOSIS — E7800 Pure hypercholesterolemia, unspecified: Secondary | ICD-10-CM

## 2024-10-06 DIAGNOSIS — I10 Essential (primary) hypertension: Secondary | ICD-10-CM

## 2024-10-15 MED ORDER — ASPIRIN 81 MG PO TBEC
81.0000 mg | DELAYED_RELEASE_TABLET | Freq: Every day | ORAL | Status: AC
Start: 2024-10-15 — End: ?

## 2025-03-26 ENCOUNTER — Ambulatory Visit: Admitting: Internal Medicine
